# Patient Record
Sex: Female | Born: 1990 | Race: Black or African American | Hispanic: No | Marital: Single | Smoking: Current every day smoker
Health system: Southern US, Community
[De-identification: ages and names within clinical notes are randomized; demographics above are authoritative.]

## PROBLEM LIST (undated history)

## (undated) HISTORY — PX: DILATION AND CURETTAGE OF UTERUS: SHX78

---

## 2009-06-27 ENCOUNTER — Emergency Department (HOSPITAL_COMMUNITY): Admission: EM | Admit: 2009-06-27 | Discharge: 2009-06-28 | Payer: Self-pay | Admitting: Emergency Medicine

## 2010-05-05 LAB — CBC
HCT: 30.7 % — ABNORMAL LOW (ref 36.0–46.0)
Hemoglobin: 10.5 g/dL — ABNORMAL LOW (ref 12.0–15.0)
MCV: 88.7 fL (ref 78.0–100.0)
RBC: 3.46 MIL/uL — ABNORMAL LOW (ref 3.87–5.11)
RDW: 13.3 % (ref 11.5–15.5)

## 2010-05-05 LAB — URINE CULTURE: Culture: NO GROWTH

## 2010-05-05 LAB — URINE MICROSCOPIC-ADD ON

## 2010-05-05 LAB — DIFFERENTIAL
Basophils Absolute: 0 10*3/uL (ref 0.0–0.1)
Basophils Relative: 0 % (ref 0–1)
Eosinophils Absolute: 0.2 10*3/uL (ref 0.0–0.7)
Eosinophils Relative: 2 % (ref 0–5)
Lymphocytes Relative: 19 % (ref 12–46)
Monocytes Absolute: 0.7 10*3/uL (ref 0.1–1.0)
Neutro Abs: 9 10*3/uL — ABNORMAL HIGH (ref 1.7–7.7)

## 2010-05-05 LAB — WET PREP, GENITAL: Yeast Wet Prep HPF POC: NONE SEEN

## 2010-05-05 LAB — URINALYSIS, ROUTINE W REFLEX MICROSCOPIC
Bilirubin Urine: NEGATIVE
Hgb urine dipstick: NEGATIVE
Nitrite: NEGATIVE
pH: 7 (ref 5.0–8.0)

## 2010-05-05 LAB — POCT PREGNANCY, URINE: Preg Test, Ur: POSITIVE

## 2010-05-05 LAB — RPR: RPR Ser Ql: NONREACTIVE

## 2010-05-05 LAB — GC/CHLAMYDIA PROBE AMP, GENITAL: Chlamydia, DNA Probe: NEGATIVE

## 2013-11-03 ENCOUNTER — Emergency Department (HOSPITAL_BASED_OUTPATIENT_CLINIC_OR_DEPARTMENT_OTHER): Payer: Medicaid Other

## 2013-11-03 ENCOUNTER — Encounter (HOSPITAL_BASED_OUTPATIENT_CLINIC_OR_DEPARTMENT_OTHER): Payer: Self-pay | Admitting: Emergency Medicine

## 2013-11-03 ENCOUNTER — Emergency Department (HOSPITAL_BASED_OUTPATIENT_CLINIC_OR_DEPARTMENT_OTHER)
Admission: EM | Admit: 2013-11-03 | Discharge: 2013-11-03 | Disposition: A | Discharge status: Home / Self Care | Payer: Medicaid Other | Attending: Emergency Medicine | Admitting: Emergency Medicine

## 2013-11-03 DIAGNOSIS — Y929 Unspecified place or not applicable: Secondary | ICD-10-CM | POA: Insufficient documentation

## 2013-11-03 DIAGNOSIS — Y9389 Activity, other specified: Secondary | ICD-10-CM | POA: Insufficient documentation

## 2013-11-03 DIAGNOSIS — S99919A Unspecified injury of unspecified ankle, initial encounter: Secondary | ICD-10-CM

## 2013-11-03 DIAGNOSIS — X500XXA Overexertion from strenuous movement or load, initial encounter: Secondary | ICD-10-CM | POA: Insufficient documentation

## 2013-11-03 DIAGNOSIS — S8990XA Unspecified injury of unspecified lower leg, initial encounter: Secondary | ICD-10-CM | POA: Diagnosis present

## 2013-11-03 DIAGNOSIS — F17200 Nicotine dependence, unspecified, uncomplicated: Secondary | ICD-10-CM | POA: Insufficient documentation

## 2013-11-03 DIAGNOSIS — IMO0002 Reserved for concepts with insufficient information to code with codable children: Secondary | ICD-10-CM | POA: Diagnosis not present

## 2013-11-03 DIAGNOSIS — S8392XA Sprain of unspecified site of left knee, initial encounter: Secondary | ICD-10-CM

## 2013-11-03 DIAGNOSIS — S99929A Unspecified injury of unspecified foot, initial encounter: Secondary | ICD-10-CM

## 2013-11-03 MED ORDER — HYDROCODONE-ACETAMINOPHEN 5-325 MG PO TABS: 1.0000 | ORAL_TABLET | ORAL | Status: DC | PRN

## 2013-11-03 MED ORDER — IBUPROFEN 800 MG PO TABS: 800.0000 mg | ORAL_TABLET | Freq: Three times a day (TID) | ORAL | Status: DC

## 2013-11-03 MED ORDER — HYDROCODONE-ACETAMINOPHEN 5-325 MG PO TABS
1.0000 | ORAL_TABLET | Freq: Once | ORAL | Status: AC
  Administered 2013-11-03: 1 via ORAL
  Filled 2013-11-03: qty 1

## 2013-11-03 MED ORDER — IBUPROFEN 800 MG PO TABS
800.0000 mg | ORAL_TABLET | Freq: Once | ORAL | Status: AC
  Administered 2013-11-03: 800 mg via ORAL
  Filled 2013-11-03: qty 1

## 2013-11-03 NOTE — Discharge Instructions (Signed)
Cryotherapy °Cryotherapy is when you put ice on your injury. Ice helps lessen pain and puffiness (swelling) after an injury. Ice works the best when you start using it in the first 24 to 48 hours after an injury. °HOME CARE °· Put a dry or damp towel between the ice pack and your skin. °· You may press gently on the ice pack. °· Leave the ice on for no more than 10 to 20 minutes at a time. °· Check your skin after 5 minutes to make sure your skin is okay. °· Rest at least 20 minutes between ice pack uses. °· Stop using ice when your skin loses feeling (numbness). °· Do not use ice on someone who cannot tell you when it hurts. This includes small children and people with memory problems (dementia). °GET HELP RIGHT AWAY IF: °· You have white spots on your skin. °· Your skin turns blue or pale. °· Your skin feels waxy or hard. °· Your puffiness gets worse. °MAKE SURE YOU:  °· Understand these instructions. °· Will watch your condition. °· Will get help right away if you are not doing well or get worse. °Document Released: 07/21/2007 Document Revised: 04/26/2011 Document Reviewed: 09/24/2010 °[employer]® Patient Information ©2015 [employer]. This information is not intended to replace advice given to you by your health care provider. Make sure you discuss any questions you have with your health care provider. ° °Joint Sprain °A sprain is a tear or stretch in the ligaments that hold a joint together. Severe sprains may need as long as 3-6 weeks of immobilization and/or exercises to heal completely. Sprained joints should be rested and protected. If not, they can become unstable and prone to re-injury. Proper treatment can reduce your pain, shorten the period of disability, and reduce the risk of repeated injuries. °TREATMENT  °· Rest and elevate the injured joint to reduce pain and swelling. °· Apply ice packs to the injury for 20-30 minutes every 2-3 hours for the next 2-3 days. °· Keep the injury wrapped in a  compression bandage or splint as long as the joint is painful or as instructed by your caregiver. °· Do not use the injured joint until it is completely healed to prevent re-injury and chronic instability. Follow the instructions of your caregiver. °· Long-term sprain management may require exercises and/or treatment by a physical therapist. Taping or special braces may help stabilize the joint until it is completely better. °SEEK MEDICAL CARE IF:  °· You develop increased pain or swelling of the joint. °· You develop increasing redness and warmth of the joint. °· You develop a fever. °· It becomes stiff. °· Your hand or foot gets cold or numb. °Document Released: 03/11/2004 Document Revised: 04/26/2011 Document Reviewed: 02/19/2008 °Exit[employer]® Patient Information ©2015 [employer]. This information is not intended to replace advice given to you by your health care provider. Make sure you discuss any questions you have with your health care provider. ° °

## 2013-11-03 NOTE — ED Provider Notes (Signed)
Medical screening examination/treatment/procedure(s) were performed by non-physician practitioner and as supervising physician I was immediately available for consultation/collaboration.  Joreen Swearingin, MD 11/03/13 2356 

## 2013-11-03 NOTE — ED Provider Notes (Signed)
CSN: [id number]     Arrival date & time 11/03/13  1556 History   First MD Initiated Contact with Patient 11/03/13 1710     Chief Complaint  Patient presents with  . Knee Pain     (Consider location/radiation/quality/duration/timing/severity/associated sxs/prior Treatment) Patient is a 23 y.o. female presenting with knee pain. The history is provided by the patient. No language interpreter was used.  Knee Pain Location:  Knee Injury: yes   Knee location:  L knee Pain details:    Severity:  Moderate   Duration:  1 day   Timing:  Constant Chronicity:  New Foreign body present:  No foreign bodies Prior injury to area:  No Associated symptoms: no fever   Associated symptoms comment:  Twisting injury to left knee last night. No fall or direct blow to knee. She complains of pain with ambulation. No other injury.   History reviewed. No pertinent past medical history. History reviewed. No pertinent past surgical history. No family history on file. History  Substance Use Topics  . Smoking status: Current Every Day Smoker  . Smokeless tobacco: Not on file  . Alcohol Use: Yes     Comment: occasionally   OB History   Grav Para Term Preterm Abortions TAB SAB Ect Mult Living                 Review of Systems  Constitutional: Negative for fever and chills.  HENT: Negative.   Respiratory: Negative.   Cardiovascular: Negative.   Gastrointestinal: Negative.   Musculoskeletal:       See HPI.  Skin: Negative.   Neurological: Negative.       Allergies  Review of patient's allergies indicates no known allergies.  Home Medications   Prior to Admission medications   Not on File   BP 108/60  Pulse 88  Temp(Src) 98.2 F (36.8 C) (Oral)  Resp 18  Ht  (1.676 m)  Wt 123 lb (55.792 kg)  BMI 19.86 kg/m2  SpO2 100%  LMP 11/02/2013 Physical Exam  Constitutional: She is oriented to person, place, and time. She appears well-developed and well-nourished.  Neck: Normal range  of motion.  Cardiovascular: Intact distal pulses.   Pulmonary/Chest: Effort normal.  Musculoskeletal:  Minimal swelling of left knee.No palpable effusion. Joint stable. Painful active and passive range of motion. No calf or thigh tenderness.   Neurological: She is alert and oriented to person, place, and time.  Skin: Skin is warm and dry.    ED Course  Procedures (including critical care time) Labs Review Labs Reviewed - No data to display  Imaging Review Dg Knee Complete 4 Views Left  11/03/2013   CLINICAL DATA:  Left knee pain following a twisting injury last night.  EXAM: LEFT KNEE - COMPLETE 4+ VIEW  COMPARISON:  None.  FINDINGS: There is no evidence of fracture, dislocation, or joint effusion. There is no evidence of arthropathy or other focal bone abnormality. Soft tissues are unremarkable.  IMPRESSION: Normal examination.   Electronically Signed   By: Gordan Payment M.D.   On: 11/03/2013 16:26     EKG Interpretation None      MDM   Final diagnoses:  None    1. Knee sprain  Negative x-rays for fracture. Joint stable without laxity. Suspect sprain injury. Refer to Dr. Pearletha Forge prn.    Arnoldo Hooker, PA-C 11/03/13 1741

## 2013-11-03 NOTE — ED Notes (Signed)
Pt reports stepping into a pothole last night and twisted left knee - causing pain and decreased ROM with edema today.

## 2014-03-21 ENCOUNTER — Emergency Department (HOSPITAL_BASED_OUTPATIENT_CLINIC_OR_DEPARTMENT_OTHER): Payer: Medicaid Other

## 2014-03-21 ENCOUNTER — Encounter (HOSPITAL_BASED_OUTPATIENT_CLINIC_OR_DEPARTMENT_OTHER): Payer: Self-pay | Admitting: *Deleted

## 2014-03-21 ENCOUNTER — Emergency Department (HOSPITAL_BASED_OUTPATIENT_CLINIC_OR_DEPARTMENT_OTHER)
Admission: EM | Admit: 2014-03-21 | Discharge: 2014-03-21 | Disposition: A | Discharge status: Home / Self Care | Payer: Medicaid Other | Attending: Emergency Medicine | Admitting: Emergency Medicine

## 2014-03-21 DIAGNOSIS — Z3A09 9 weeks gestation of pregnancy: Secondary | ICD-10-CM | POA: Diagnosis not present

## 2014-03-21 DIAGNOSIS — Z791 Long term (current) use of non-steroidal anti-inflammatories (NSAID): Secondary | ICD-10-CM | POA: Insufficient documentation

## 2014-03-21 DIAGNOSIS — Z79891 Long term (current) use of opiate analgesic: Secondary | ICD-10-CM | POA: Diagnosis not present

## 2014-03-21 DIAGNOSIS — O039 Complete or unspecified spontaneous abortion without complication: Secondary | ICD-10-CM | POA: Insufficient documentation

## 2014-03-21 DIAGNOSIS — O98811 Other maternal infectious and parasitic diseases complicating pregnancy, first trimester: Secondary | ICD-10-CM | POA: Insufficient documentation

## 2014-03-21 DIAGNOSIS — Z87891 Personal history of nicotine dependence: Secondary | ICD-10-CM | POA: Diagnosis not present

## 2014-03-21 DIAGNOSIS — N939 Abnormal uterine and vaginal bleeding, unspecified: Secondary | ICD-10-CM

## 2014-03-21 DIAGNOSIS — O209 Hemorrhage in early pregnancy, unspecified: Secondary | ICD-10-CM | POA: Diagnosis present

## 2014-03-21 DIAGNOSIS — B379 Candidiasis, unspecified: Secondary | ICD-10-CM | POA: Insufficient documentation

## 2014-03-21 LAB — CBC WITH DIFFERENTIAL/PLATELET
BASOS ABS: 0 10*3/uL (ref 0.0–0.1)
Basophils Relative: 0 % (ref 0–1)
Eosinophils Absolute: 0.1 10*3/uL (ref 0.0–0.7)
Eosinophils Relative: 2 % (ref 0–5)
HEMATOCRIT: 35.4 % — AB (ref 36.0–46.0)
Hemoglobin: 12 g/dL (ref 12.0–15.0)
LYMPHS PCT: 38 % (ref 12–46)
Lymphs Abs: 2.4 10*3/uL (ref 0.7–4.0)
MCH: 29.4 pg (ref 26.0–34.0)
MCHC: 33.9 g/dL (ref 30.0–36.0)
MCV: 86.8 fL (ref 78.0–100.0)
MONO ABS: 0.5 10*3/uL (ref 0.1–1.0)
Monocytes Relative: 7 % (ref 3–12)
NEUTROS ABS: 3.3 10*3/uL (ref 1.7–7.7)
NEUTROS PCT: 53 % (ref 43–77)
PLATELETS: 242 10*3/uL (ref 150–400)
RBC: 4.08 MIL/uL (ref 3.87–5.11)
RDW: 11.7 % (ref 11.5–15.5)
WBC: 6.3 10*3/uL (ref 4.0–10.5)

## 2014-03-21 LAB — BASIC METABOLIC PANEL
Anion gap: 4 — ABNORMAL LOW (ref 5–15)
BUN: 8 mg/dL (ref 6–23)
CO2: 23 mmol/L (ref 19–32)
Calcium: 8.8 mg/dL (ref 8.4–10.5)
Chloride: 108 mmol/L (ref 96–112)
Creatinine, Ser: 0.65 mg/dL (ref 0.50–1.10)
GFR calc Af Amer: >90 mL/min (ref >90)
GLUCOSE: 90 mg/dL (ref 70–99)
POTASSIUM: 3.8 mmol/L (ref 3.5–5.1)
Sodium: 135 mmol/L (ref 135–145)

## 2014-03-21 LAB — URINALYSIS, ROUTINE W REFLEX MICROSCOPIC
BILIRUBIN URINE: NEGATIVE
Glucose, UA: NEGATIVE mg/dL
HGB URINE DIPSTICK: NEGATIVE
KETONES UR: 15 mg/dL — AB
NITRITE: NEGATIVE
PH: 6.5 (ref 5.0–8.0)
Protein, ur: NEGATIVE mg/dL
SPECIFIC GRAVITY, URINE: 1.029 (ref 1.005–1.030)
UROBILINOGEN UA: 1 mg/dL (ref 0.0–1.0)

## 2014-03-21 LAB — WET PREP, GENITAL
Clue Cells Wet Prep HPF POC: NONE SEEN
Trich, Wet Prep: NONE SEEN

## 2014-03-21 LAB — URINE MICROSCOPIC-ADD ON

## 2014-03-21 LAB — PREGNANCY, URINE: Preg Test, Ur: POSITIVE — AB

## 2014-03-21 LAB — RH IG WORKUP (INCLUDES ABO/RH)
ABO/RH(D): O POS
Gestational Age(Wks): 12

## 2014-03-21 LAB — HCG, QUANTITATIVE, PREGNANCY: HCG, BETA CHAIN, QUANT, S: 41985 m[IU]/mL — AB (ref <5)

## 2014-03-21 MED ORDER — FLUCONAZOLE 50 MG PO TABS
150.0000 mg | ORAL_TABLET | Freq: Once | ORAL | Status: AC
  Administered 2014-03-21: 150 mg via ORAL
  Filled 2014-03-21 (×2): qty 1

## 2014-03-21 NOTE — ED Provider Notes (Signed)
CSN: [id number]     Arrival date & time 03/21/14  1126 History   First MD Initiated Contact with Patient 03/21/14 1204     Chief Complaint  Patient presents with  . Vaginal Bleeding    8wk preg     (Consider location/radiation/quality/duration/timing/severity/associated sxs/prior Treatment) HPI Comments: Pt comes in with c/o vaginal bleeding during pregnancy. Pt is nine weeks old and has confirmed urine pregnancy at ob but has not had any testing. G3p2; states that she was having some white thick discharge consistent with yeast and she decided to treat with monistat otc. And in the last couple of days the discharge has been brown and white. Mild abdominal cramping. Unsure of blood type.   The history is provided by the patient.    History reviewed. No pertinent past medical history. History reviewed. No pertinent past surgical history. No family history on file. History  Substance Use Topics  . Smoking status: Former Smoker    Types: Cigarettes    Quit date: 02/28/2014  . Smokeless tobacco: Never Used  . Alcohol Use: No     Comment: occasionally   OB History    Gravida Para Term Preterm AB TAB SAB Ectopic Multiple Living   1              Review of Systems  All other systems reviewed and are negative.     Allergies  Review of patient's allergies indicates no known allergies.  Home Medications   Prior to Admission medications   Medication Sig Start Date End Date Taking? Authorizing Provider  Prenatal Multivit-Min-Fe-FA (PRENATAL VITAMINS PO) Take by mouth.   Yes Historical Provider, MD  HYDROcodone-acetaminophen (NORCO/VICODIN) 5-325 MG per tablet Take 1-2 tablets by mouth every 4 (four) hours as needed for moderate pain. 11/03/13   Shari A Upstill, PA-C  ibuprofen (ADVIL,MOTRIN) 800 MG tablet Take 1 tablet (800 mg total) by mouth 3 (three) times daily. 11/03/13   Melvenia BeamShari A Upstill, PA-C   BP 111/68 mmHg  Pulse 94  Temp(Src) 98.3 F (36.8 C) (Oral)  Resp 16  SpO2 100%   LMP 01/11/2014 Physical Exam  Constitutional: She is oriented to person, place, and time. She appears well-developed and well-nourished.  Cardiovascular: Normal rate and regular rhythm.   Pulmonary/Chest: Effort normal and breath sounds normal.  Abdominal: Soft. Bowel sounds are normal. There is no tenderness.  Genitourinary:  Thick white and brown discharge. -cmt  Musculoskeletal: Normal range of motion.  Neurological: She is alert and oriented to person, place, and time.  Skin: Skin is warm and dry.  Psychiatric: She has a normal mood and affect.  Nursing note and vitals reviewed.   ED Course  Procedures (including critical care time) Labs Review Labs Reviewed  WET PREP, GENITAL - Abnormal; Notable for the following:    Yeast Wet Prep HPF POC TOO NUMEROUS TO COUNT (*)    WBC, Wet Prep HPF POC MANY (*)    All other components within normal limits  PREGNANCY, URINE - Abnormal; Notable for the following:    Preg Test, Ur POSITIVE (*)    All other components within normal limits  URINALYSIS, ROUTINE W REFLEX MICROSCOPIC - Abnormal; Notable for the following:    Ketones, ur 15 (*)    Leukocytes, UA SMALL (*)    All other components within normal limits  URINE MICROSCOPIC-ADD ON - Abnormal; Notable for the following:    Bacteria, UA FEW (*)    All other components within normal limits  CBC WITH DIFFERENTIAL/PLATELET - Abnormal; Notable for the following:    HCT 35.4 (*)    All other components within normal limits  BASIC METABOLIC PANEL - Abnormal; Notable for the following:    Anion gap 4 (*)    All other components within normal limits  HCG, QUANTITATIVE, PREGNANCY - Abnormal; Notable for the following:    hCG, Beta Chain, Quant, [place] [address] (*)    All other components within normal limits  URINE CULTURE  RH IG WORKUP (INCLUDES ABO/RH)  GC/CHLAMYDIA PROBE AMP ([place])    Imaging Review US Ob Comp Less 14 Wks  03/21/2014   CLINICAL DATA:  Vaginal bleeding and pelvic  cramping in first trimester pregnancy.  EXAM: OBSTETRIC <14 WK [place] AND TRANSVAGINAL OB US  TECHNIQUE: Both transabdominal and transvaginal ultrasound examinations were performed for complete evaluation of the gestation as well as the maternal uterus, adnexal regions, and pelvic cul-de-sac. Transvaginal technique was performed to assess early pregnancy.  COMPARISON:  09/01/2012  FINDINGS: Intrauterine gestational sac: Present. There is a large subchorionic hemorrhage encompassing at least 50% of the gestational sac circumference.  Yolk sac: Present but abnormally enlarged, measuring 8 mm in diameter.  Embryo:  Present  Cardiac Activity: Not visible by motion Doppler, color Doppler, or cine imaging.  CRL:   22  mm   8 w 6 d  Maternal uterus/adnexae: Corpus luteum noted in the right ovary. Otherwise, the ovaries are unremarkable. No free pelvic fluid.  IMPRESSION: Single intrauterine gestation, 22 mm crown-rump length with no cardiac activity. Findings meet definitive criteria for failed pregnancy. This follows SRU consensus guidelines: Diagnostic Criteria for Nonviable Pregnancy Early in the First Trimester. [facility] [phone].   Electronically Signed   By: Tiburcio Pea M.D.   On: 03/21/2014 14:31   US Ob Transvaginal  03/21/2014   CLINICAL DATA:  Vaginal bleeding and pelvic cramping in first trimester pregnancy.  EXAM: OBSTETRIC <14 WK [place] AND TRANSVAGINAL OB US  TECHNIQUE: Both transabdominal and transvaginal ultrasound examinations were performed for complete evaluation of the gestation as well as the maternal uterus, adnexal regions, and pelvic cul-de-sac. Transvaginal technique was performed to assess early pregnancy.  COMPARISON:  09/01/2012  FINDINGS: Intrauterine gestational sac: Present. There is a large subchorionic hemorrhage encompassing at least 50% of the gestational sac circumference.  Yolk sac: Present but abnormally enlarged, measuring 8 mm in diameter.  Embryo:  Present  Cardiac  Activity: Not visible by motion Doppler, color Doppler, or cine imaging.  CRL:   22  mm   8 w 6 d  Maternal uterus/adnexae: Corpus luteum noted in the right ovary. Otherwise, the ovaries are unremarkable. No free pelvic fluid.  IMPRESSION: Single intrauterine gestation, 22 mm crown-rump length with no cardiac activity. Findings meet definitive criteria for failed pregnancy. This follows SRU consensus guidelines: Diagnostic Criteria for Nonviable Pregnancy Early in the First Trimester. [facility] [phone].   Electronically Signed   By: Tiburcio Pea M.D.   On: 03/21/2014 14:31     EKG Interpretation None      MDM   Final diagnoses:  Miscarriage  Candidiasis    Discussed results with pt. Pt has ob at [facility]. Discussed follow up and pt verbalized understanding. Pt given diflucan here for yeast    Teressa Lower, NP 03/21/14 1457  Linwood Dibbles, MD 03/21/14 [phone]

## 2014-03-21 NOTE — Discharge Instructions (Signed)
As discussed you need to follow up with your doctor tomorrow or Monday. If you develop more bleeding then one pad per hour you need to be seen again Miscarriage A miscarriage is the sudden loss of an unborn baby (fetus) before the 20th week of pregnancy. Most miscarriages happen in the first 3 months of pregnancy. Sometimes, it happens before a woman even knows she is pregnant. A miscarriage is also called a "spontaneous miscarriage" or "early pregnancy loss." Having a miscarriage can be an emotional experience. Talk with your caregiver about any questions you may have about miscarrying, the grieving process, and your future pregnancy plans. CAUSES   Problems with the fetal chromosomes that make it impossible for the baby to develop normally. Problems with the baby's genes or chromosomes are most often the result of errors that occur, by chance, as the embryo divides and grows. The problems are not inherited from the parents.  Infection of the cervix or uterus.   Hormone problems.   Problems with the cervix, such as having an incompetent cervix. This is when the tissue in the cervix is not strong enough to hold the pregnancy.   Problems with the uterus, such as an abnormally shaped uterus, uterine fibroids, or congenital abnormalities.   Certain medical conditions.   Smoking, drinking alcohol, or taking illegal drugs.   Trauma.  Often, the cause of a miscarriage is unknown.  SYMPTOMS   Vaginal bleeding or spotting, with or without cramps or pain.  Pain or cramping in the abdomen or lower back.  Passing fluid, tissue, or blood clots from the vagina. DIAGNOSIS  Your caregiver will perform a physical exam. You may also have an ultrasound to confirm the miscarriage. Blood or urine tests may also be ordered. TREATMENT   Sometimes, treatment is not necessary if you naturally pass all the fetal tissue that was in the uterus. If some of the fetus or placenta remains in the body  (incomplete miscarriage), tissue left behind may become infected and must be removed. Usually, a dilation and curettage (D and C) procedure is performed. During a D and C procedure, the cervix is widened (dilated) and any remaining fetal or placental tissue is gently removed from the uterus.  Antibiotic medicines are prescribed if there is an infection. Other medicines may be given to reduce the size of the uterus (contract) if there is a lot of bleeding.  If you have Rh negative blood and your baby was Rh positive, you will need a Rh immunoglobulin shot. This shot will protect any future baby from having Rh blood problems in future pregnancies. HOME CARE INSTRUCTIONS   Your caregiver may order bed rest or may allow you to continue light activity. Resume activity as directed by your caregiver.  Have someone help with home and family responsibilities during this time.   Keep track of the number of sanitary pads you use each day and how soaked (saturated) they are. Write down this information.   Do not use tampons. Do not douche or have sexual intercourse until approved by your caregiver.   Only take over-the-counter or prescription medicines for pain or discomfort as directed by your caregiver.   Do not take aspirin. Aspirin can cause bleeding.   Keep all follow-up appointments with your caregiver.   If you or your partner have problems with grieving, talk to your caregiver or seek counseling to help cope with the pregnancy loss. Allow enough time to grieve before trying to get pregnant again.  SEEK  IMMEDIATE MEDICAL CARE IF:   You have severe cramps or pain in your back or abdomen.  You have a fever.  You pass large blood clots (walnut-sized or larger) ortissue from your vagina. Save any tissue for your caregiver to inspect.   Your bleeding increases.   You have a thick, bad-smelling vaginal discharge.  You become lightheaded, weak, or you faint.   You have chills.   MAKE SURE YOU:  Understand these instructions.  Will watch your condition.  Will get help right away if you are not doing well or get worse. Document Released: 07/28/2000 Document Revised: 05/29/2012 Document Reviewed: 03/23/2011 [facility] Patient Information 2015 [place], [place]. This information is not intended to replace advice given to you by your health care provider. Make sure you discuss any questions you have with your health care provider.

## 2014-03-21 NOTE — ED Notes (Signed)
Reports vaginal bleeding x 3 days- pt pregnant, LMP Nov 27, 3rd pregnancy

## 2014-03-22 LAB — URINE CULTURE
CULTURE: NO GROWTH
Colony Count: NO GROWTH

## 2014-03-22 LAB — GC/CHLAMYDIA PROBE AMP (~~LOC~~) NOT AT ARMC
CHLAMYDIA, DNA PROBE: NEGATIVE
NEISSERIA GONORRHEA: NEGATIVE

## 2014-04-18 ENCOUNTER — Emergency Department (HOSPITAL_BASED_OUTPATIENT_CLINIC_OR_DEPARTMENT_OTHER)
Admission: EM | Admit: 2014-04-18 | Discharge: 2014-04-18 | Disposition: A | Discharge status: Home / Self Care | Payer: Medicaid Other | Attending: Emergency Medicine | Admitting: Emergency Medicine

## 2014-04-18 ENCOUNTER — Encounter (HOSPITAL_BASED_OUTPATIENT_CLINIC_OR_DEPARTMENT_OTHER): Payer: Self-pay

## 2014-04-18 DIAGNOSIS — Z791 Long term (current) use of non-steroidal anti-inflammatories (NSAID): Secondary | ICD-10-CM | POA: Diagnosis not present

## 2014-04-18 DIAGNOSIS — Z87891 Personal history of nicotine dependence: Secondary | ICD-10-CM | POA: Insufficient documentation

## 2014-04-18 DIAGNOSIS — Z3202 Encounter for pregnancy test, result negative: Secondary | ICD-10-CM | POA: Insufficient documentation

## 2014-04-18 DIAGNOSIS — N72 Inflammatory disease of cervix uteri: Secondary | ICD-10-CM | POA: Diagnosis not present

## 2014-04-18 DIAGNOSIS — R102 Pelvic and perineal pain: Secondary | ICD-10-CM | POA: Diagnosis present

## 2014-04-18 LAB — PREGNANCY, URINE: Preg Test, Ur: NEGATIVE

## 2014-04-18 LAB — URINE MICROSCOPIC-ADD ON

## 2014-04-18 LAB — URINALYSIS, ROUTINE W REFLEX MICROSCOPIC
BILIRUBIN URINE: NEGATIVE
Glucose, UA: NEGATIVE mg/dL
HGB URINE DIPSTICK: NEGATIVE
KETONES UR: NEGATIVE mg/dL
NITRITE: NEGATIVE
Protein, ur: NEGATIVE mg/dL
Specific Gravity, Urine: 1.029 (ref 1.005–1.030)
UROBILINOGEN UA: 1 mg/dL (ref 0.0–1.0)
pH: 6 (ref 5.0–8.0)

## 2014-04-18 LAB — WET PREP, GENITAL
Trich, Wet Prep: NONE SEEN
Yeast Wet Prep HPF POC: NONE SEEN

## 2014-04-18 MED ORDER — LIDOCAINE HCL (PF) 1 % IJ SOLN
INTRAMUSCULAR | Status: AC
  Administered 2014-04-18: 1.2 mL
  Filled 2014-04-18: qty 5

## 2014-04-18 MED ORDER — OXYCODONE-ACETAMINOPHEN 5-325 MG PO TABS: 1.0000 | ORAL_TABLET | ORAL | Status: DC | PRN

## 2014-04-18 MED ORDER — METRONIDAZOLE 500 MG PO TABS: 500.0000 mg | ORAL_TABLET | Freq: Two times a day (BID) | ORAL | Status: DC

## 2014-04-18 MED ORDER — CEFTRIAXONE SODIUM 250 MG IJ SOLR
250.0000 mg | Freq: Once | INTRAMUSCULAR | Status: AC
  Administered 2014-04-18: 250 mg via INTRAMUSCULAR
  Filled 2014-04-18: qty 250

## 2014-04-18 MED ORDER — AZITHROMYCIN 250 MG PO TABS
1000.0000 mg | ORAL_TABLET | Freq: Once | ORAL | Status: AC
  Administered 2014-04-18: 1000 mg via ORAL
  Filled 2014-04-18: qty 4

## 2014-04-18 MED ORDER — VALACYCLOVIR HCL 1 G PO TABS: 1000.0000 mg | ORAL_TABLET | Freq: Two times a day (BID) | ORAL | Status: DC

## 2014-04-18 MED ORDER — ACETAMINOPHEN 325 MG PO TABS
650.0000 mg | ORAL_TABLET | Freq: Once | ORAL | Status: AC
  Administered 2014-04-18: 650 mg via ORAL
  Filled 2014-04-18: qty 2

## 2014-04-18 NOTE — ED Notes (Signed)
Reports dx with yeast infection at last visit. Sts she has been using "cream" that was given to her son. Reports "bumps" to vagina.

## 2014-04-18 NOTE — ED Notes (Signed)
Pt had D&X 2 weeks ago

## 2014-04-18 NOTE — ED Notes (Signed)
Pt. Reports she has had a yeast infection for over a week and being treated for this yeast infection with not getting any better.  Pt. Reports she is still having burning and itching with pain and has only 2 pills left for treating the yeast infection.

## 2014-04-18 NOTE — Discharge Instructions (Signed)
Cervicitis Cervicitis is a soreness and swelling (inflammation) of the cervix. Your cervix is located at the bottom of your uterus. It opens up to the vagina. CAUSES   Sexually transmitted infections (STIs).   Allergic reaction.   Medicines or birth control devices that are put in the vagina.   Injury to the cervix.   Bacterial infections.  RISK FACTORS You are at greater risk if you:  Have unprotected sexual intercourse.  Have sexual intercourse with many partners.  Began sexual intercourse at an early age.  Have a history of STIs. SYMPTOMS  There may be no symptoms. If symptoms occur, they may include:   Gray, white, yellow, or bad-smelling vaginal discharge.   Pain or itching of the area outside the vagina.   Painful sexual intercourse.   Lower abdominal or lower back pain, especially during intercourse.   Frequent urination.   Abnormal vaginal bleeding between periods, after sexual intercourse, or after menopause.   Pressure or a heavy feeling in the pelvis.  DIAGNOSIS  Diagnosis is made after a pelvic exam. Other tests may include:   Examination of any discharge under a microscope (wet prep).   A Pap test.  TREATMENT  Treatment will depend on the cause of cervicitis. If it is caused by an STI, both you and your partner will need to be treated. Antibiotic medicines will be given.  HOME CARE INSTRUCTIONS   Do not have sexual intercourse until your health care provider says it is okay.   Do not have sexual intercourse until your partner has been treated, if your cervicitis is caused by an STI.   Take your antibiotics as directed. Finish them even if you start to feel better.  SEEK MEDICAL CARE IF:  Your symptoms come back.   You have a fever.  MAKE SURE YOU:   Understand these instructions.  Will watch your condition.  Will get help right away if you are not doing well or get worse. Document Released: 02/01/2005 Document Revised:  02/06/2013 Document Reviewed: 07/26/2012 [facility] Patient Information 2015 [place], [place]. This information is not intended to replace advice given to you by your health care provider. Make sure you discuss any questions you have with your health care provider.   Genital Herpes Genital herpes is a sexually transmitted disease. This means that it is a disease passed by having sex with an infected person. There is no cure for genital herpes. The time between attacks can be months to years. The virus may live in a person but produce no problems (symptoms). This infection can be passed to a baby as it travels down the birth canal (vagina). In a newborn, this can cause central nervous system damage, eye damage, or even death. The virus that causes genital herpes is usually HSV-2 virus. The virus that causes oral herpes is usually HSV-1. The diagnosis (learning what is wrong) is made through culture results. SYMPTOMS  Usually symptoms of pain and itching begin a few days to a week after contact. It first appears as small blisters that progress to small painful ulcers which then scab over and heal after several days. It affects the outer genitalia, birth canal, cervix, penis, anal area, buttocks, and thighs. HOME CARE INSTRUCTIONS   Keep ulcerated areas dry and clean.  Take medications as directed. Antiviral medications can speed up healing. They will not prevent recurrences or cure this infection. These medications can also be taken for suppression if there are frequent recurrences.  While the infection is active,  it is contagious. Avoid all sexual contact during active infections.  Condoms may help prevent spread of the herpes virus.  Practice safe sex.  Wash your hands thoroughly after touching the genital area.  Avoid touching your eyes after touching your genital area.  Inform your caregiver if you have had genital herpes and become pregnant. It is your responsibility to insure a safe outcome  for your baby in this pregnancy.  Only take over-the-counter or prescription medicines for pain, discomfort, or fever as directed by your caregiver. SEEK MEDICAL CARE IF:   You have a recurrence of this infection.  You do not respond to medications and are not improving.  You have new sources of pain or discharge which have changed from the original infection.  You have an oral temperature above 102 F (38.9 C).  You develop abdominal pain.  You develop eye pain or signs of eye infection. Document Released: 01/30/2000 Document Revised: 04/26/2011 Document Reviewed: 02/19/2009 [facility] Patient Information 2015 [place]. This information is not intended to replace advice given to you by your health care provider. Make sure you discuss any questions you have with your health care provider.   Bacterial Vaginosis Bacterial vaginosis is a vaginal infection that occurs when the normal balance of bacteria in the vagina is disrupted. It results from an overgrowth of certain bacteria. This is the most common vaginal infection in women of childbearing age. Treatment is important to prevent complications, especially in pregnant women, as it can cause a premature delivery. CAUSES  Bacterial vaginosis is caused by an increase in harmful bacteria that are normally present in smaller amounts in the vagina. Several different kinds of bacteria can cause bacterial vaginosis. However, the reason that the condition develops is not fully understood. RISK FACTORS Certain activities or behaviors can put you at an increased risk of developing bacterial vaginosis, including:  Having a new sex partner or multiple sex partners.  Douching.  Using an intrauterine device (IUD) for contraception. Women do not get bacterial vaginosis from toilet seats, bedding, swimming pools, or contact with objects around them. SIGNS AND SYMPTOMS  Some women with bacterial vaginosis have no signs or symptoms. Common  symptoms include:  Grey vaginal discharge.  A fishlike odor with discharge, especially after sexual intercourse.  Itching or burning of the vagina and vulva.  Burning or pain with urination. DIAGNOSIS  Your health care provider will take a medical history and examine the vagina for signs of bacterial vaginosis. A sample of vaginal fluid may be taken. Your health care provider will look at this sample under a microscope to check for bacteria and abnormal cells. A vaginal pH test may also be done.  TREATMENT  Bacterial vaginosis may be treated with antibiotic medicines. These may be given in the form of a pill or a vaginal cream. A second round of antibiotics may be prescribed if the condition comes back after treatment.  HOME CARE INSTRUCTIONS   Only take over-the-counter or prescription medicines as directed by your health care provider.  If antibiotic medicine was prescribed, take it as directed. Make sure you finish it even if you start to feel better.  Do not have sex until treatment is completed.  Tell all sexual partners that you have a vaginal infection. They should see their health care provider and be treated if they have problems, such as a mild rash or itching.  Practice safe sex by using condoms and only having one sex partner. SEEK MEDICAL CARE IF:  Your symptoms are not improving after 3 days of treatment.  You have increased discharge or pain.  You have a fever. MAKE SURE YOU:   Understand these instructions.  Will watch your condition.  Will get help right away if you are not doing well or get worse. FOR MORE INFORMATION  Centers for Disease Control and Prevention, Division of STD Prevention: [employer] American Sexual Health Association (ASHA): www.ashastd.org  Document Released: 02/01/2005 Document Revised: 11/22/2012 Document Reviewed: 09/13/2012 [facility] Patient Information 2015 [place], [place]. This information is not intended to replace advice  given to you by your health care provider. Make sure you discuss any questions you have with your health care provider.

## 2014-04-18 NOTE — ED Provider Notes (Signed)
CSN: [id number]     Arrival date & time 04/18/14  1445 History   First MD Initiated Contact with Patient 04/18/14 1543     Chief Complaint  Patient presents with  . Vaginal Pain     (Consider location/radiation/quality/duration/timing/severity/associated sxs/prior Treatment) HPI  24 year old female presents with vaginal pain, bleeding, and "cuts" for the past 3 days. She states 4 days ago she had intercourse but denies any pain during intercourse. This pain started the next day. She denies having discharge that she states is a yeast infection for the past one week. She was given an antibiotic for it (cefdinir). Also being treated for a UTI. Patient is also complaining of pain in her buttocks. She has also noticed bumps on her labia. Rates her pain as severe. The OB/GYN she saw her earlier rather ibuprofen and Percocet and she is now out of the Percocet. That seems to be the only thing that was helping.  History reviewed. No pertinent past medical history. History reviewed. No pertinent past surgical history. No family history on file. History  Substance Use Topics  . Smoking status: Former Smoker    Types: Cigarettes    Quit date: 02/28/2014  . Smokeless tobacco: Never Used  . Alcohol Use: No     Comment: occasionally   OB History    Gravida Para Term Preterm AB TAB SAB Ectopic Multiple Living   1              Review of Systems  Gastrointestinal: Negative for blood in stool.  Genitourinary: Positive for dysuria, vaginal bleeding, vaginal discharge and vaginal pain.  All other systems reviewed and are negative.     Allergies  Review of patient's allergies indicates no known allergies.  Home Medications   Prior to Admission medications   Medication Sig Start Date End Date Taking? Authorizing Provider  HYDROcodone-acetaminophen (NORCO/VICODIN) 5-325 MG per tablet Take 1-2 tablets by mouth every 4 (four) hours as needed for moderate pain. 11/03/13   Shari A Upstill, PA-C    ibuprofen (ADVIL,MOTRIN) 800 MG tablet Take 1 tablet (800 mg total) by mouth 3 (three) times daily. 11/03/13   Shari A Upstill, PA-C  Prenatal Multivit-Min-Fe-FA (PRENATAL VITAMINS PO) Take by mouth.    Historical Provider, MD   BP 144/89 mmHg  Pulse 84  Temp(Src) 97.6 F (36.4 C) (Oral)  Resp 16  Ht  (1.676 m)  Wt 126 lb (57.153 kg)  BMI 20.35 kg/m2  SpO2 100%  LMP 01/11/2014 Physical Exam  Constitutional: She is oriented to person, place, and time. She appears well-developed and well-nourished.  HENT:  Head: Normocephalic and atraumatic.  Right Ear: External ear normal.  Left Ear: External ear normal.  Nose: Nose normal.  Eyes: Right eye exhibits no discharge. Left eye exhibits no discharge.  Neck: Neck supple.  Cardiovascular: Normal rate, regular rhythm and normal heart sounds.   Pulmonary/Chest: Effort normal and breath sounds normal.  Abdominal: Soft. She exhibits no distension. There is tenderness in the suprapubic area.  Genitourinary:    There is lesion (fluid filled vesicles) on the right labia. Uterus is not enlarged and not tender. Cervix exhibits discharge and friability. Right adnexum displays no mass. Left adnexum displays no mass. No bleeding in the vagina. No signs of injury around the vagina. Vaginal discharge found.  Neurological: She is alert and oriented to person, place, and time.  Skin: Skin is warm and dry.  Nursing note and vitals reviewed.   ED Course  Procedures (  including critical care time) Labs Review Labs Reviewed  WET PREP, GENITAL - Abnormal; Notable for the following:    Clue Cells Wet Prep HPF POC MODERATE (*)    WBC, Wet Prep HPF POC MODERATE (*)    All other components within normal limits  URINALYSIS, ROUTINE W REFLEX MICROSCOPIC - Abnormal; Notable for the following:    APPearance CLOUDY (*)    Leukocytes, UA MODERATE (*)    All other components within normal limits  URINE MICROSCOPIC-ADD ON - Abnormal; Notable for the  following:    Squamous Epithelial / LPF FEW (*)    Bacteria, UA FEW (*)    All other components within normal limits  PREGNANCY, URINE  GC/CHLAMYDIA PROBE AMP ([place])    Imaging Review No results found.   EKG Interpretation None      MDM   Final diagnoses:  Cervicitis    Patient's external exam is concerning for new onset herpes. She'll be treated with valacyclovir. She also be given oral pain control. In the discharge and friability seen on her cervix she will also be covered for gonorrhea and chlamydia. She is complaining of a sheet odored discharge and has moderate clue cells. Given this we'll treat with Flagyl as well. I discussed the importance of following up with her GYN as soon as possible for possible scraping and confirmatory HSV testing. Patient verbalized understanding.    Audree CamelScott T Lucresha Dismuke, MD 04/18/14 [phone]

## 2014-04-19 LAB — GC/CHLAMYDIA PROBE AMP (~~LOC~~) NOT AT ARMC
Chlamydia: NEGATIVE
Neisseria Gonorrhea: NEGATIVE

## 2014-05-14 ENCOUNTER — Emergency Department (HOSPITAL_BASED_OUTPATIENT_CLINIC_OR_DEPARTMENT_OTHER)
Admission: EM | Admit: 2014-05-14 | Discharge: 2014-05-14 | Disposition: A | Discharge status: Home / Self Care | Payer: Medicaid Other | Attending: Emergency Medicine | Admitting: Emergency Medicine

## 2014-05-14 ENCOUNTER — Emergency Department (HOSPITAL_BASED_OUTPATIENT_CLINIC_OR_DEPARTMENT_OTHER): Payer: Medicaid Other

## 2014-05-14 ENCOUNTER — Encounter (HOSPITAL_BASED_OUTPATIENT_CLINIC_OR_DEPARTMENT_OTHER): Payer: Self-pay | Admitting: Emergency Medicine

## 2014-05-14 DIAGNOSIS — W01198A Fall on same level from slipping, tripping and stumbling with subsequent striking against other object, initial encounter: Secondary | ICD-10-CM | POA: Diagnosis not present

## 2014-05-14 DIAGNOSIS — S62609A Fracture of unspecified phalanx of unspecified finger, initial encounter for closed fracture: Secondary | ICD-10-CM

## 2014-05-14 DIAGNOSIS — S62657A Nondisplaced fracture of medial phalanx of left little finger, initial encounter for closed fracture: Secondary | ICD-10-CM | POA: Diagnosis not present

## 2014-05-14 DIAGNOSIS — Z87891 Personal history of nicotine dependence: Secondary | ICD-10-CM | POA: Insufficient documentation

## 2014-05-14 DIAGNOSIS — Y9289 Other specified places as the place of occurrence of the external cause: Secondary | ICD-10-CM | POA: Insufficient documentation

## 2014-05-14 DIAGNOSIS — Y9302 Activity, running: Secondary | ICD-10-CM | POA: Diagnosis not present

## 2014-05-14 DIAGNOSIS — Y998 Other external cause status: Secondary | ICD-10-CM | POA: Diagnosis not present

## 2014-05-14 DIAGNOSIS — S6992XA Unspecified injury of left wrist, hand and finger(s), initial encounter: Secondary | ICD-10-CM | POA: Diagnosis present

## 2014-05-14 MED ORDER — NAPROXEN 500 MG PO TABS: 500.0000 mg | ORAL_TABLET | Freq: Two times a day (BID) | ORAL | Status: DC
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
## 2016-07-07 ENCOUNTER — Emergency Department (HOSPITAL_BASED_OUTPATIENT_CLINIC_OR_DEPARTMENT_OTHER)
Admission: EM | Admit: 2016-07-07 | Discharge: 2016-07-07 | Disposition: A | Discharge status: Home / Self Care | Payer: Medicaid Other | Attending: Physician Assistant | Admitting: Physician Assistant

## 2016-07-07 ENCOUNTER — Encounter (HOSPITAL_BASED_OUTPATIENT_CLINIC_OR_DEPARTMENT_OTHER): Payer: Self-pay | Admitting: *Deleted

## 2016-07-07 ENCOUNTER — Emergency Department (HOSPITAL_BASED_OUTPATIENT_CLINIC_OR_DEPARTMENT_OTHER): Payer: Medicaid Other

## 2016-07-07 DIAGNOSIS — N760 Acute vaginitis: Secondary | ICD-10-CM

## 2016-07-07 DIAGNOSIS — O99331 Smoking (tobacco) complicating pregnancy, first trimester: Secondary | ICD-10-CM | POA: Insufficient documentation

## 2016-07-07 DIAGNOSIS — O23591 Infection of other part of genital tract in pregnancy, first trimester: Secondary | ICD-10-CM | POA: Insufficient documentation

## 2016-07-07 DIAGNOSIS — F17210 Nicotine dependence, cigarettes, uncomplicated: Secondary | ICD-10-CM | POA: Insufficient documentation

## 2016-07-07 DIAGNOSIS — B9689 Other specified bacterial agents as the cause of diseases classified elsewhere: Secondary | ICD-10-CM

## 2016-07-07 DIAGNOSIS — Z3A01 Less than 8 weeks gestation of pregnancy: Secondary | ICD-10-CM | POA: Insufficient documentation

## 2016-07-07 LAB — WET PREP, GENITAL
SPERM: NONE SEEN
Trich, Wet Prep: NONE SEEN
Yeast Wet Prep HPF POC: NONE SEEN

## 2016-07-07 LAB — CBC WITH DIFFERENTIAL/PLATELET
Basophils Absolute: 0 10*3/uL (ref 0.0–0.1)
Basophils Relative: 0 %
Eosinophils Absolute: 0.1 10*3/uL (ref 0.0–0.7)
Eosinophils Relative: 1 %
HCT: 30.5 % — ABNORMAL LOW (ref 36.0–46.0)
Hemoglobin: 10 g/dL — ABNORMAL LOW (ref 12.0–15.0)
LYMPHS ABS: 3.1 10*3/uL (ref 0.7–4.0)
LYMPHS PCT: 43 %
MCH: 27.6 pg (ref 26.0–34.0)
MCHC: 32.8 g/dL (ref 30.0–36.0)
MCV: 84.3 fL (ref 78.0–100.0)
MONO ABS: 0.5 10*3/uL (ref 0.1–1.0)
MONOS PCT: 7 %
Neutro Abs: 3.5 10*3/uL (ref 1.7–7.7)
Neutrophils Relative %: 49 %
PLATELETS: 237 10*3/uL (ref 150–400)
RBC: 3.62 MIL/uL — ABNORMAL LOW (ref 3.87–5.11)
RDW: 13.6 % (ref 11.5–15.5)
WBC: 7.2 10*3/uL (ref 4.0–10.5)

## 2016-07-07 LAB — URINALYSIS, ROUTINE W REFLEX MICROSCOPIC
BILIRUBIN URINE: NEGATIVE
GLUCOSE, UA: NEGATIVE mg/dL
Hgb urine dipstick: NEGATIVE
Ketones, ur: NEGATIVE mg/dL
Leukocytes, UA: NEGATIVE
Nitrite: NEGATIVE
Protein, ur: NEGATIVE mg/dL
SPECIFIC GRAVITY, URINE: 1.026 (ref 1.005–1.030)
pH: 7.5 (ref 5.0–8.0)

## 2016-07-07 LAB — COMPREHENSIVE METABOLIC PANEL
ALT: 11 U/L — ABNORMAL LOW (ref 14–54)
ANION GAP: 7 (ref 5–15)
AST: 14 U/L — ABNORMAL LOW (ref 15–41)
Albumin: 3.9 g/dL (ref 3.5–5.0)
Alkaline Phosphatase: 37 U/L — ABNORMAL LOW (ref 38–126)
BUN: 9 mg/dL (ref 6–20)
CO2: 24 mmol/L (ref 22–32)
CREATININE: 0.71 mg/dL (ref 0.44–1.00)
Calcium: 8.4 mg/dL — ABNORMAL LOW (ref 8.9–10.3)
Chloride: 106 mmol/L (ref 101–111)
GFR calc non Af Amer: >60 mL/min (ref >60)
Glucose, Bld: 89 mg/dL (ref 65–99)
Potassium: 3.5 mmol/L (ref 3.5–5.1)
SODIUM: 137 mmol/L (ref 135–145)
Total Bilirubin: 0.3 mg/dL (ref 0.3–1.2)
Total Protein: 6.5 g/dL (ref 6.5–8.1)

## 2016-07-07 LAB — PREGNANCY, URINE: Preg Test, Ur: POSITIVE — AB

## 2016-07-07 LAB — ABO/RH: ABO/RH(D): O POS

## 2016-07-07 LAB — HCG, QUANTITATIVE, PREGNANCY: HCG, BETA CHAIN, QUANT, S: 40451 m[IU]/mL — AB (ref <5)

## 2016-07-07 MED ORDER — METRONIDAZOLE 500 MG PO TABS
500.0000 mg | ORAL_TABLET | Freq: Once | ORAL | Status: AC
  Administered 2016-07-07: 500 mg via ORAL
  Filled 2016-07-07: qty 1

## 2016-07-07 MED ORDER — METRONIDAZOLE 500 MG PO TABS: 500.0000 mg | ORAL_TABLET | Freq: Two times a day (BID) | ORAL | 0 refills | Status: AC

## 2016-07-07 NOTE — ED Provider Notes (Signed)
[facility] Provider Note   CSN: [id number] Arrival date & time: 07/07/16  1712  By signing my name below, I, Rosana Fretana Waskiewicz, attest that this documentation has been prepared under the direction and in the presence of Hamish Banks, Cindee Saltourteney Lyn, MD. Electronically Signed: Rosana Fretana Waskiewicz, ED Scribe. 07/07/16. 6:55 PM.  History   Chief Complaint Chief Complaint  Patient presents with  . Vaginal Discharge    The history is provided by the patient. No language interpreter was used.   HPI Comments: Dana Riggs is a pregnant  26 y.o. female with a PMHx of miscarriage, who presents to the Emergency Department complaining of sudden onset, moderate vaginal discharge onset 2 days ago. Pt has had a hx of similar symptoms in one of her past pregnancies. G5 P3 A1. Pt reports associated vaginal bleeding (spotting), headache, abdominal pain, and back pain. Pt states she sees Dr. [facility]el at [facility]. No other complaints at this time.  History reviewed. No pertinent past medical history.  There are no active problems to display for this patient.   Past Surgical History:  Procedure Laterality Date  . DILATION AND CURETTAGE OF UTERUS      OB History    Gravida Para Term Preterm AB Living   1             SAB TAB Ectopic Multiple Live Births                   Home Medications    Prior to Admission medications   Not on File    Family History History reviewed. No pertinent family history.  Social History Social History  Substance Use Topics  . Smoking status: Current Every Day Smoker    Packs/day: 0.50    Types: Cigarettes    Last attempt to quit: 02/28/2014  . Smokeless tobacco: Never Used  . Alcohol use No     Comment: occasionally   DIAGNOSTIC STUDIES: Oxygen Saturation is 100% on RA, normal by my interpretation.   COORDINATION OF CARE: 10:48 PM-Discussed next steps with pt. Pt verbalized understanding and is agreeable with the plan.     Allergies   Patient has no known allergies.   Review of Systems Review of Systems  Gastrointestinal: Positive for abdominal pain.  Genitourinary: Positive for vaginal bleeding and vaginal discharge.  Musculoskeletal: Positive for back pain.  Neurological: Positive for headaches.  All other systems reviewed and are negative.    Physical Exam Updated Vital Signs BP 111/73 (BP Location: Right Arm)   Pulse 65   Temp 97.8 F (36.6 C)   Resp 18   Ht 5\' 3"  (1.6 m)   Wt 54.4 kg (120 lb)   LMP 06/08/2016   SpO2 100%   BMI 21.26 kg/m   Physical Exam  Constitutional: She is oriented to person, place, and time. She appears well-developed and well-nourished. No distress.  HENT:  Head: Normocephalic and atraumatic.  Eyes: Pupils are equal, round, and reactive to light.  Pulmonary/Chest: Effort normal.  Genitourinary: Vaginal discharge found.  Genitourinary Comments: Cervix is finger tip open. Gray discharge at the cervical os.    Neurological: She is alert and oriented to person, place, and time.  Skin: Skin is warm and dry.  Psychiatric: She has a normal mood and affect.  Nursing note and vitals reviewed.    ED Treatments / Results  DIAGNOSTIC STUDIES: Oxygen Saturation is 100% on RA, normal by my interpretation.   COORDINATION OF CARE: 6:46 PM-Discussed next  steps with pt including lab work, transvaginal US and a pelvic exam. Pt verbalized understanding and is agreeable with the plan.   Labs (all labs ordered are listed, but only abnormal results are displayed) Labs Reviewed  WET PREP, GENITAL - Abnormal; Notable for the following:       Result Value   Clue Cells Wet Prep HPF POC PRESENT (*)    WBC, Wet Prep HPF POC MODERATE (*)    All other components within normal limits  PREGNANCY, URINE - Abnormal; Notable for the following:    Preg Test, Ur POSITIVE (*)    All other components within normal limits  CBC WITH DIFFERENTIAL/PLATELET - Abnormal; Notable for  the following:    RBC 3.62 (*)    Hemoglobin 10.0 (*)    HCT 30.5 (*)    All other components within normal limits  COMPREHENSIVE METABOLIC PANEL - Abnormal; Notable for the following:    Calcium 8.4 (*)    AST 14 (*)    ALT 11 (*)    Alkaline Phosphatase 37 (*)    All other components within normal limits  HCG, QUANTITATIVE, PREGNANCY - Abnormal; Notable for the following:    hCG, Beta Chain, Quant, S 40,451 (*)    All other components within normal limits  URINALYSIS, ROUTINE W REFLEX MICROSCOPIC  ABO/RH  GC/CHLAMYDIA PROBE AMP ([place]) NOT AT [facility]    EKG  EKG Interpretation None       Radiology US Ob Comp Less 14 Wks  Result Date: 07/07/2016 CLINICAL DATA:  Positive pregnancy test and vaginal bleeding. EXAM: OBSTETRIC <14 WK [place] AND TRANSVAGINAL OB US TECHNIQUE: Both transabdominal and transvaginal ultrasound examinations were performed for complete evaluation of the gestation as well as the maternal uterus, adnexal regions, and pelvic cul-de-sac. Transvaginal technique was performed to assess early pregnancy. COMPARISON:  03/21/2014 FINDINGS: Intrauterine gestational sac: Present Yolk sac:  Visualized. Embryo:  Visualized. Cardiac Activity: Not Visualized. CRL:  3  mm   5 w   6 d                  [place] EDC: 03/03/2017 Subchorionic hemorrhage:  None visualized. Maternal uterus/adnexae: Within the uterus, there is a second fluid-filled structure. Structure is larger than the gestational sac and measures 3.6 x 2.0 x 2.6 cm. This structure has internal irregular echoes. There is not a yolk sac or fetal pole within this second larger intrauterine fluid collection. Normal appearance of the left ovary. Small amount of free fluid. Evidence for a right ovarian corpus luteal cyst. IMPRESSION: Intrauterine pregnancy with calculated gestational age of [redacted] weeks and 6 days. Question a twin pregnancy with a second fluid-filled structure within the uterus. This second fluid-filled structure is  larger than the other gestational sac and there is no evidence for a yolk sac or fetus within this fluid collection. This fluid collection is also not compatible with a typical subchorionic hemorrhage. This could represent a twin fetal demise. Recommend follow up ultrasound and OB consultation. Electronically Signed   By: Richarda Overlie M.D.   On: 07/07/2016 21:42   US Ob Transvaginal  Result Date: 07/07/2016 CLINICAL DATA:  Positive pregnancy test and vaginal bleeding. EXAM: OBSTETRIC <14 WK [place] AND TRANSVAGINAL OB US TECHNIQUE: Both transabdominal and transvaginal ultrasound examinations were performed for complete evaluation of the gestation as well as the maternal uterus, adnexal regions, and pelvic cul-de-sac. Transvaginal technique was performed to assess early pregnancy. COMPARISON:  03/21/2014 FINDINGS: Intrauterine gestational sac: Present  Yolk sac:  Visualized. Embryo:  Visualized. Cardiac Activity: Not Visualized. CRL:  3  mm   5 w   6 d                  [place] EDC: 03/03/2017 Subchorionic hemorrhage:  None visualized. Maternal uterus/adnexae: Within the uterus, there is a second fluid-filled structure. Structure is larger than the gestational sac and measures 3.6 x 2.0 x 2.6 cm. This structure has internal irregular echoes. There is not a yolk sac or fetal pole within this second larger intrauterine fluid collection. Normal appearance of the left ovary. Small amount of free fluid. Evidence for a right ovarian corpus luteal cyst. IMPRESSION: Intrauterine pregnancy with calculated gestational age of [redacted] weeks and 6 days. Question a twin pregnancy with a second fluid-filled structure within the uterus. This second fluid-filled structure is larger than the other gestational sac and there is no evidence for a yolk sac or fetus within this fluid collection. This fluid collection is also not compatible with a typical subchorionic hemorrhage. This could represent a twin fetal demise. Recommend follow up ultrasound and  OB consultation. Electronically Signed   By: Richarda Overlie M.D.   On: 07/07/2016 21:42    Procedures Procedures (including critical care time)  Medications Ordered in ED Medications  metroNIDAZOLE (FLAGYL) tablet 500 mg (not administered)     Initial Impression / Assessment and Plan / ED Course  I have reviewed the triage vital signs and the nursing notes.  Pertinent labs & imaging results that were available during my care of the patient were reviewed by me and considered in my medical decision making (see chart for details).    I personally performed the services described in this documentation, which was scribed in my presence. The recorded information has been reviewed and is accurate.   Patient here with discharge and bleeding in early pregnancy. Patient was diagnosed with pregnancy today. We'll get hCG, ABO. Transvaginal ultrasound. Vaginal exam shows gray discharge and bloody discharge with a mildly open os fingertip.  10:48 PM Patient's ABO positive. Patient's ultrasound shows a di-di pregnancy with fetal demise of 1 and normally developement of the other. Discussed with her OB/GYN and call for Dr. Allena Katz. (dr. Philis Pique) the plan is to have patient follow up with the OB/GYN group either tomorrow or the next day. Discussed with patient. This is an undesired pregnancy. We will have her bring this up with her OB/GYN group. Because of this we will treat her bacterial vaginosis with metronidazole.  Final Clinical Impressions(s) / ED Diagnoses   Final diagnoses:  None    New Prescriptions New Prescriptions   No medications on file     Abelino Derrick, MD 07/07/16 2248

## 2016-07-07 NOTE — ED Triage Notes (Signed)
Pt c/o vaginal discharge x 1 week, vaginal spotting x 2 days, ? preg

## 2016-07-07 NOTE — Discharge Instructions (Signed)
You were seen today for bleeding and cramping. He was diagnosed with new pregnancy. It one of the embryos is growing well. The other embryo looks to have fetal demise. We think this tissue continue to pass.   Please follow up with your OB/GYN tomorrow or the next day. You also have bacterial vaginosis which syou should  treat with the medication provided.

## 2016-07-08 LAB — GC/CHLAMYDIA PROBE AMP (~~LOC~~) NOT AT ARMC
Chlamydia: NEGATIVE
Neisseria Gonorrhea: NEGATIVE

## 2016-07-26 ENCOUNTER — Emergency Department (HOSPITAL_BASED_OUTPATIENT_CLINIC_OR_DEPARTMENT_OTHER): Payer: Medicaid Other

## 2016-07-26 ENCOUNTER — Emergency Department (HOSPITAL_BASED_OUTPATIENT_CLINIC_OR_DEPARTMENT_OTHER)
Admission: EM | Admit: 2016-07-26 | Discharge: 2016-07-26 | Disposition: A | Discharge status: Home / Self Care | Payer: Medicaid Other | Attending: Emergency Medicine | Admitting: Emergency Medicine

## 2016-07-26 ENCOUNTER — Encounter (HOSPITAL_BASED_OUTPATIENT_CLINIC_OR_DEPARTMENT_OTHER): Payer: Self-pay | Admitting: Emergency Medicine

## 2016-07-26 DIAGNOSIS — O200 Threatened abortion: Secondary | ICD-10-CM

## 2016-07-26 DIAGNOSIS — Z3A08 8 weeks gestation of pregnancy: Secondary | ICD-10-CM | POA: Insufficient documentation

## 2016-07-26 DIAGNOSIS — O208 Other hemorrhage in early pregnancy: Secondary | ICD-10-CM | POA: Insufficient documentation

## 2016-07-26 DIAGNOSIS — O468X1 Other antepartum hemorrhage, first trimester: Secondary | ICD-10-CM

## 2016-07-26 DIAGNOSIS — O26891 Other specified pregnancy related conditions, first trimester: Secondary | ICD-10-CM | POA: Insufficient documentation

## 2016-07-26 DIAGNOSIS — O209 Hemorrhage in early pregnancy, unspecified: Secondary | ICD-10-CM | POA: Insufficient documentation

## 2016-07-26 DIAGNOSIS — F17210 Nicotine dependence, cigarettes, uncomplicated: Secondary | ICD-10-CM | POA: Insufficient documentation

## 2016-07-26 DIAGNOSIS — O418X10 Other specified disorders of amniotic fluid and membranes, first trimester, not applicable or unspecified: Secondary | ICD-10-CM

## 2016-07-26 LAB — HCG, QUANTITATIVE, PREGNANCY: hCG, Beta Chain, Quant, S: 167660 m[IU]/mL — ABNORMAL HIGH (ref <5)

## 2016-07-26 NOTE — ED Triage Notes (Signed)
Vag bleeding off and on since last week

## 2016-07-26 NOTE — ED Notes (Signed)
Patient transported to Ultrasound 

## 2016-07-26 NOTE — ED Provider Notes (Signed)
[facility] Note   CSN: [id number] Arrival date & time: 07/26/16  0849     History   Chief Complaint Chief Complaint  Patient presents with  . Vaginal Bleeding    HPI Dana Riggs is a 26 y.o. female.  HPI   26 year old female with vaginal bleeding. Patient seen the emergency room on 5/23. She was diagnosed with a possible twin gestation with fetal demise of one of them. She reports that she followed up with her OB/GYN but that practice did not support her desire for abortion. Her spotting and discharge did improve after last ED visit but began having spotting again Thursday. This morning she was having intercourse and had "a lot of blood" which has since slowed. Some sharp lower back pain at that time which is now a dull ache. No urinary complaints. No dizziness or lightheadedness.   History reviewed. No pertinent past medical history.  There are no active problems to display for this patient.   Past Surgical History:  Procedure Laterality Date  . DILATION AND CURETTAGE OF UTERUS      OB History    Gravida Para Term Preterm AB Living   2             SAB TAB Ectopic Multiple Live Births                   Home Medications    Prior to Admission medications   Medication Sig Start Date End Date Taking? Authorizing Provider  metroNIDAZOLE (FLAGYL) 500 MG tablet Take 1 tablet (500 mg total) by mouth 2 (two) times daily. 07/07/16   Mackuen, Cindee Saltourteney Lyn, MD    Family History History reviewed. No pertinent family history.  Social History Social History  Substance Use Topics  . Smoking status: Current Every Day Smoker    Packs/day: 0.50    Types: Cigarettes    Last attempt to quit: 02/28/2014  . Smokeless tobacco: Never Used  . Alcohol use No     Comment: occasionally     Allergies   Patient has no known allergies.   Review of Systems Review of Systems  All systems reviewed and negative, other than as noted in HPI.   Physical  Exam Updated Vital Signs BP 95/74   Pulse 81   Temp 98 F (36.7 C)   Resp 18   Ht 5\' 5"  (1.651 m)   Wt 54.4 kg (120 lb)   LMP 06/14/2016   SpO2 100%   BMI 19.97 kg/m   Physical Exam  Constitutional: She appears well-developed and well-nourished. No distress.  HENT:  Head: Normocephalic and atraumatic.  Eyes: Conjunctivae are normal. Right eye exhibits no discharge. Left eye exhibits no discharge.  Neck: Neck supple.  Cardiovascular: Normal rate, regular rhythm and normal heart sounds.  Exam reveals no gallop and no friction rub.   No murmur heard. Pulmonary/Chest: Effort normal and breath sounds normal. No respiratory distress.  Abdominal: Soft. She exhibits no distension. There is no tenderness.  Musculoskeletal: She exhibits no edema or tenderness.  Neurological: She is alert.  Skin: Skin is warm and dry.  Psychiatric: She has a normal mood and affect. Her behavior is normal. Thought content normal.  Nursing note and vitals reviewed.    ED Treatments / Results  Labs (all labs ordered are listed, but only abnormal results are displayed) Labs Reviewed  HCG, QUANTITATIVE, PREGNANCY - Abnormal; Notable for the following:       Result Value  hCG, Beta ChainMahalia Longest 161,096 (*)    All other components within normal limits    EKG  EKG Interpretation None       Radiology US Ob Comp Less 14 Wks  Result Date: 07/26/2016 CLINICAL DATA:  Heavy vaginal bleeding EXAM: OBSTETRIC <14 WK ULTRASOUND TECHNIQUE: Transabdominal ultrasound was performed for evaluation of the gestation as well as the maternal uterus and adnexal regions. COMPARISON:  07/07/2016 FINDINGS: Intrauterine gestational sac: Single Yolk sac:  Visualized. Embryo:  Visualized. Cardiac Activity: Visualized. Heart Rate: 168 bpm CRL:   22.3  mm   8 w 6 d                  [place] EDC: 03/01/2017 Subchorionic hemorrhage: Moderate to large subchorionic/chorionic hemorrhage. Maternal uterus/adnexae: Bilateral ovaries are  within normal limits, noting a right corpus luteal cyst. No free fluid. IMPRESSION: Single live intrauterine gestation, with estimated gestational age [redacted] weeks 6 days by crown-rump length. Moderate to large subchorionic/chorionic hemorrhage. Electronically Signed   By: Charline Bills M.D.   On: 07/26/2016 11:17    Procedures Procedures (including critical care time)  Medications Ordered in ED Medications - No data to display   Initial Impression / Assessment and Plan / ED Course  I have reviewed the triage vital signs and the nursing notes.  Pertinent labs & imaging results that were available during my care of the patient were reviewed by me and considered in my medical decision making (see chart for details).     25yF with vaginal bleeding. Pregnancy diagnosed in ED on 5/23. [place] then with likely twin gestation and demise of one. Rh positive. HD stable. Abdominal exam benign. Will obtain another quant although I'm not sure how twin gestation/demise of one may possibly effect levels. Can [place] to evaluate viability of other fetus. Bleeding significantly slowed now  11:25 AM [place] as above. Threatened miscarriage. Moderate/large subchorionic hemorrhage. Expectant management. OB FU. Prenatal vitamins if going to keep pregnancy. Return precautions discussed.   Final Clinical Impressions(s) / ED Diagnoses   Final diagnoses:  Threatened miscarriage  Subchorionic hematoma in first trimester, single or unspecified fetus    New Prescriptions New Prescriptions   No medications on file     Raeford Razor, MD 07/26/16 1129

## 2016-08-23 ENCOUNTER — Emergency Department (HOSPITAL_BASED_OUTPATIENT_CLINIC_OR_DEPARTMENT_OTHER): Payer: Self-pay

## 2016-08-23 ENCOUNTER — Encounter (HOSPITAL_BASED_OUTPATIENT_CLINIC_OR_DEPARTMENT_OTHER): Payer: Self-pay | Admitting: Emergency Medicine

## 2016-08-23 ENCOUNTER — Emergency Department (HOSPITAL_BASED_OUTPATIENT_CLINIC_OR_DEPARTMENT_OTHER)
Admission: EM | Admit: 2016-08-23 | Discharge: 2016-08-23 | Disposition: A | Discharge status: Home / Self Care | Payer: Self-pay | Attending: Emergency Medicine | Admitting: Emergency Medicine

## 2016-08-23 DIAGNOSIS — O9989 Other specified diseases and conditions complicating pregnancy, childbirth and the puerperium: Secondary | ICD-10-CM | POA: Insufficient documentation

## 2016-08-23 DIAGNOSIS — O43101 Malformation of placenta, unspecified, first trimester: Secondary | ICD-10-CM

## 2016-08-23 DIAGNOSIS — F17210 Nicotine dependence, cigarettes, uncomplicated: Secondary | ICD-10-CM | POA: Insufficient documentation

## 2016-08-23 DIAGNOSIS — O209 Hemorrhage in early pregnancy, unspecified: Secondary | ICD-10-CM | POA: Insufficient documentation

## 2016-08-23 DIAGNOSIS — Z79899 Other long term (current) drug therapy: Secondary | ICD-10-CM | POA: Insufficient documentation

## 2016-08-23 DIAGNOSIS — O200 Threatened abortion: Secondary | ICD-10-CM

## 2016-08-23 DIAGNOSIS — Z3A13 13 weeks gestation of pregnancy: Secondary | ICD-10-CM | POA: Insufficient documentation

## 2016-08-23 LAB — CBC WITH DIFFERENTIAL/PLATELET
Basophils Absolute: 0 K/uL (ref 0.0–0.1)
Basophils Relative: 0 %
Eosinophils Absolute: 0.1 K/uL (ref 0.0–0.7)
Eosinophils Relative: 1 %
HCT: 32.5 % — ABNORMAL LOW (ref 36.0–46.0)
Hemoglobin: 11.3 g/dL — ABNORMAL LOW (ref 12.0–15.0)
Lymphocytes Relative: 31 %
Lymphs Abs: 2.3 K/uL (ref 0.7–4.0)
MCH: 28.7 pg (ref 26.0–34.0)
MCHC: 34.8 g/dL (ref 30.0–36.0)
MCV: 82.5 fL (ref 78.0–100.0)
Monocytes Absolute: 0.4 K/uL (ref 0.1–1.0)
Monocytes Relative: 6 %
Neutro Abs: 4.7 K/uL (ref 1.7–7.7)
Neutrophils Relative %: 62 %
Platelets: 231 K/uL (ref 150–400)
RBC: 3.94 MIL/uL (ref 3.87–5.11)
RDW: 13.6 % (ref 11.5–15.5)
WBC: 7.6 K/uL (ref 4.0–10.5)

## 2016-08-23 LAB — COMPREHENSIVE METABOLIC PANEL
ALBUMIN: 3.9 g/dL (ref 3.5–5.0)
ALT: 11 U/L — AB (ref 14–54)
AST: 17 U/L (ref 15–41)
Alkaline Phosphatase: 34 U/L — ABNORMAL LOW (ref 38–126)
Anion gap: 9 (ref 5–15)
BILIRUBIN TOTAL: 0.4 mg/dL (ref 0.3–1.2)
BUN: 12 mg/dL (ref 6–20)
CHLORIDE: 104 mmol/L (ref 101–111)
CO2: 23 mmol/L (ref 22–32)
CREATININE: 0.74 mg/dL (ref 0.44–1.00)
Calcium: 8.8 mg/dL — ABNORMAL LOW (ref 8.9–10.3)
GFR calc Af Amer: >60 mL/min (ref >60)
GLUCOSE: 103 mg/dL — AB (ref 65–99)
Potassium: 3.1 mmol/L — ABNORMAL LOW (ref 3.5–5.1)
Sodium: 136 mmol/L (ref 135–145)
TOTAL PROTEIN: 7.1 g/dL (ref 6.5–8.1)

## 2016-08-23 LAB — WET PREP, GENITAL
Clue Cells Wet Prep HPF POC: NONE SEEN
Sperm: NONE SEEN
Trich, Wet Prep: NONE SEEN
Yeast Wet Prep HPF POC: NONE SEEN

## 2016-08-23 LAB — HCG, QUANTITATIVE, PREGNANCY: hCG, Beta Chain, Quant, S: 63806 m[IU]/mL — ABNORMAL HIGH

## 2016-08-23 MED ORDER — POTASSIUM CHLORIDE CRYS ER 20 MEQ PO TBCR
40.0000 meq | EXTENDED_RELEASE_TABLET | Freq: Once | ORAL | Status: AC
  Administered 2016-08-23: 40 meq via ORAL
  Filled 2016-08-23: qty 2

## 2016-08-23 MED ORDER — SODIUM CHLORIDE 0.9 % IV BOLUS (SEPSIS)
1000.0000 mL | Freq: Once | INTRAVENOUS | Status: AC
Start: 2016-08-23 — End: 2016-08-23
  Administered 2016-08-23: 1000 mL via INTRAVENOUS

## 2016-08-23 NOTE — ED Notes (Signed)
Patient transported to ultrasound.

## 2016-08-23 NOTE — ED Notes (Signed)
ED Provider at bedside. 

## 2016-08-23 NOTE — ED Triage Notes (Signed)
Pt states she is approximately 2 months pregnant. She had twins but lost one of them. Has not been to OB yet. Pt states she has been having vaginal bleeding with cramping this week.

## 2016-08-23 NOTE — ED Provider Notes (Signed)
[facility]   CSN: [id number] Arrival date & time: 08/23/16  1249     History   Chief Complaint Chief Complaint  Patient presents with  . Vaginal Bleeding    HPI Dana Riggs is a 26 y.o. female about [redacted] weeks pregnant, here presenting with vaginal bleeding. Patient was seen at the end of May for vaginal bleeding. At that time there seems to be possible twin pregnancy with fetal demise vs IUP with subchorionic hematoma. Patient was then seen a month ago for vaginal bleeding and had IUP with subchorionic hematoma. Patient states that over the last week or so, she has been having persistent spotting that stopped yesterday. She also has persistent lower abdominal cramps. Denies any fever or vomiting or urinary symptoms. Patient was recently treated for bacterial vaginosis and still has some brown colored discharge.      The history is provided by the patient.    History reviewed. No pertinent past medical history.  There are no active problems to display for this patient.   Past Surgical History:  Procedure Laterality Date  . DILATION AND CURETTAGE OF UTERUS      OB History    Gravida Para Term Preterm AB Living   2             SAB TAB Ectopic Multiple Live Births                   Home Medications    Prior to Admission medications   Medication Sig Start Date End Date Taking? Authorizing Provider  metroNIDAZOLE (FLAGYL) 500 MG tablet Take 1 tablet (500 mg total) by mouth 2 (two) times daily. 07/07/16   Mackuen, Cindee Salt, MD    Family History No family history on file.  Social History Social History  Substance Use Topics  . Smoking status: Current Every Day Smoker    Packs/day: 0.50    Types: Cigarettes    Last attempt to quit: 02/28/2014  . Smokeless tobacco: Never Used  . Alcohol use No     Comment: occasionally     Allergies   Patient has no known allergies.   Review of Systems Review of Systems  Gastrointestinal:  Positive for abdominal pain.  Genitourinary: Positive for vaginal bleeding.  All other systems reviewed and are negative.    Physical Exam Updated Vital Signs BP 104/75   Pulse 82   Temp 98.2 F (36.8 C) (Oral)   Resp 20   LMP 06/14/2016   SpO2 100%   Physical Exam  Constitutional: She is oriented to person, place, and time. She appears well-developed and well-nourished.  Anxious   HENT:  Head: Normocephalic.  Mouth/Throat: Oropharynx is clear and moist.  Eyes: EOM are normal. Pupils are equal, round, and reactive to light.  Neck: Normal range of motion. Neck supple.  Cardiovascular: Regular rhythm and normal heart sounds.   Slightly tachy   Pulmonary/Chest: Effort normal and breath sounds normal. No respiratory distress. She has no wheezes. She has no rales.  Abdominal: Soft. Bowel sounds are normal.  Gravid uterus   Genitourinary:  Genitourinary Comments: Brownish discharge. Os closed. Mild uterine tenderness, no adnexal tenderness   Musculoskeletal: Normal range of motion.  Neurological: She is alert and oriented to person, place, and time.  Skin: Skin is warm.  Psychiatric: She has a normal mood and affect.  Nursing note and vitals reviewed.    ED Treatments / Results  Labs (all labs ordered are listed, but  only abnormal results are displayed) Labs Reviewed  WET PREP, GENITAL - Abnormal; Notable for the following:       Result Value   WBC, Wet Prep HPF POC MANY (*)    All other components within normal limits  CBC WITH DIFFERENTIAL/PLATELET - Abnormal; Notable for the following:    Hemoglobin 11.3 (*)    HCT 32.5 (*)    All other components within normal limits  COMPREHENSIVE METABOLIC PANEL - Abnormal; Notable for the following:    Potassium 3.1 (*)    Glucose, Bld 103 (*)    Calcium 8.8 (*)    ALT 11 (*)    Alkaline Phosphatase 34 (*)    All other components within normal limits  HCG, QUANTITATIVE, PREGNANCY - Abnormal; Notable for the following:     hCG, Beta Chain, Quant, S 63,806 (*)    All other components within normal limits  GC/CHLAMYDIA PROBE AMP ([place]) NOT AT [facility]    EKG  EKG Interpretation None       Radiology [place] Ob Comp Less 14 Wks  Result Date: 08/23/2016 CLINICAL DATA:  Vaginal bleeding for 2 days, G5 P3 A1 EXAM: OBSTETRIC <14 WK ULTRASOUND TECHNIQUE: Transabdominal ultrasound was performed for evaluation of the gestation as well as the maternal uterus and adnexal regions. COMPARISON:  07/26/2016 FINDINGS: Intrauterine gestational sac: Present Yolk sac:  Not visualize Embryo:  Present Cardiac Activity: Present Heart Rate: 152 bpm CRL:   68.2  mm   13 w 1 d                  US EDC: 02/27/2017 Subchorionic hemorrhage:  None visualized. Maternal uterus/adnexae: RIGHT ovary normal size and morphology. LEFT ovary not visualized. Placenta is low-lying, cannot completely exclude placenta previa ; umbilical cord insertion is low near the cervix, recommend assessment on follow-up imaging. IMPRESSION: Single live intrauterine gestation as above. Low lying placenta at or near internal cervical os cannot exclude previa with note of low umbilical cord insertion near internal cervical os ; recommend assessment on follow-up imaging. Electronically Signed   By: Ulyses SouthwardMark  Boles M.D.   On: 08/23/2016 14:20    Procedures Procedures (including critical care time)  Medications Ordered in ED Medications  potassium chloride SA (K-DUR,KLOR-CON) CR tablet 40 mEq (not administered)  sodium chloride 0.9 % bolus 1,000 mL (0 mLs Intravenous Stopped 08/23/16 1514)     Initial Impression / Assessment and Plan / ED Course  I have reviewed the triage vital signs and the nursing notes.  Pertinent labs & imaging results that were available during my care of the patient were reviewed by me and considered in my medical decision making (see chart for details).     Dana Riggs is a 26 y.o. female here with vaginal bleeding. About [redacted] weeks pregnant  by date. Blood type is O positive. Likely threatened abortion. Will get labs, HCG, repeat OB US.   3:21 PM US showed possible placental previa likely explaining her continual bleeding. Hg stable. Tachycardia resolved with IVF. I [facility] Shawn[facility]att [facility]. She states that since patient is only [redacted] weeks pregnant, will not do any intervention. Recommend no sexual intercourse and follow up with OB.    Final Clinical Impressions(s) / ED Diagnoses   Final diagnoses:  None    New Prescriptions New Prescriptions   No medications on file     Charlynne PanderYao, David Hsienta, MD 08/23/16 [phone]

## 2016-08-23 NOTE — Discharge Instructions (Signed)
Your placenta may be very low down and that may cause bleeding. Avoid having sexual intercourse if you have bleeding.   [facility]   You may have some bleeding   Return to ER if you have worse bleeding, clots or tissue coming out, severe cramps, vomiting, fever.

## 2016-08-24 LAB — GC/CHLAMYDIA PROBE AMP (~~LOC~~) NOT AT ARMC
CHLAMYDIA, DNA PROBE: NEGATIVE
NEISSERIA GONORRHEA: NEGATIVE

## 2016-12-21 ENCOUNTER — Other Ambulatory Visit: Payer: Self-pay

## 2016-12-21 ENCOUNTER — Encounter (HOSPITAL_BASED_OUTPATIENT_CLINIC_OR_DEPARTMENT_OTHER): Payer: Self-pay | Admitting: Emergency Medicine

## 2016-12-21 ENCOUNTER — Emergency Department (HOSPITAL_BASED_OUTPATIENT_CLINIC_OR_DEPARTMENT_OTHER)
Admission: EM | Admit: 2016-12-21 | Discharge: 2016-12-21 | Disposition: A | Discharge status: Home / Self Care | Payer: Medicaid Other | Attending: Emergency Medicine | Admitting: Emergency Medicine

## 2016-12-21 DIAGNOSIS — M542 Cervicalgia: Secondary | ICD-10-CM | POA: Insufficient documentation

## 2016-12-21 DIAGNOSIS — O9989 Other specified diseases and conditions complicating pregnancy, childbirth and the puerperium: Secondary | ICD-10-CM | POA: Insufficient documentation

## 2016-12-21 DIAGNOSIS — F17210 Nicotine dependence, cigarettes, uncomplicated: Secondary | ICD-10-CM | POA: Insufficient documentation

## 2016-12-21 DIAGNOSIS — R070 Pain in throat: Secondary | ICD-10-CM | POA: Diagnosis not present

## 2016-12-21 DIAGNOSIS — Z3A29 29 weeks gestation of pregnancy: Secondary | ICD-10-CM | POA: Insufficient documentation

## 2016-12-21 DIAGNOSIS — O99333 Smoking (tobacco) complicating pregnancy, third trimester: Secondary | ICD-10-CM | POA: Insufficient documentation

## 2016-12-21 MED ORDER — OXYCODONE HCL 5 MG PO TABS
5.0000 mg | ORAL_TABLET | Freq: Once | ORAL | Status: AC
  Administered 2016-12-21: 5 mg via ORAL
  Filled 2016-12-21: qty 1

## 2016-12-21 NOTE — ED Notes (Signed)
ED Provider at bedside. 

## 2016-12-21 NOTE — ED Provider Notes (Signed)
[facility] EMERGENCY DEPARTMENT Provider Note   CSN: [id number] Arrival date & time: 12/21/16  [id number]     History   Chief Complaint Chief Complaint  Patient presents with  . Neck Pain    HPI Dana Riggs is a 26 y.o. female.  The history is provided by the patient. No language interpreter was used.  Neck Pain     Dana Riggs is a 26 y.o. female who presents to the Emergency Department complaining of neck pain.  She is a G4 P3 that presents for evaluation of 2 days of left-sided neck pain.  Pain begins at the bottom of her left jawline and radiates to the left lateral neck.  It is worse with swallowing.  She reports mild sore throat.  No shortness of breath, fevers, chest pain, numbness, weakness.  No known injuries.  No prior similar symptom.  She has tried Tylenol at home with no improvement in her symptoms.  She is also tried warm compresses.  She is currently [redacted] weeks pregnant.  She is feeling the baby move, no leakage of fluids or vaginal bleeding. History reviewed. No pertinent past medical history.  There are no active problems to display for this patient.   Past Surgical History:  Procedure Laterality Date  . DILATION AND CURETTAGE OF UTERUS      OB History    Gravida Para Term Preterm AB Living   2             SAB TAB Ectopic Multiple Live Births                   Home Medications    Prior to Admission medications   Medication Sig Start Date End Date Taking? Authorizing Provider  metroNIDAZOLE (FLAGYL) 500 MG tablet Take 1 tablet (500 mg total) by mouth 2 (two) times daily. 07/07/16   Mackuen, Cindee Saltourteney Lyn, MD    Family History No family history on file.  Social History Social History   Tobacco Use  . Smoking status: Current Every Day Smoker    Packs/day: 0.50    Types: Cigarettes    Last attempt to quit: 02/28/2014    Years since quitting: 2.8  . Smokeless tobacco: Never Used  Substance Use Topics  . Alcohol use: No   Comment: occasionally  . Drug use: No     Allergies   Patient has no known allergies.   Review of Systems Review of Systems  Musculoskeletal: Positive for neck pain.  All other systems reviewed and are negative.    Physical Exam Updated Vital Signs BP 118/71 (BP Location: Right Arm)   Pulse 92   Temp 98 F (36.7 C) (Oral)   Resp 16   Ht 5\' 6"  (1.676 m)   Wt 59.4 kg (131 lb)   LMP 06/14/2016   SpO2 100%   BMI 21.14 kg/m   Physical Exam  Constitutional: She is oriented to person, place, and time. She appears well-developed and well-nourished.  HENT:  Head: Normocephalic and atraumatic.  Right Ear: External ear normal.  Left Ear: External ear normal.  Mouth/Throat: Oropharynx is clear and moist.  Eyes: EOM are normal.  Neck: Neck supple. No thyromegaly present.  Tenderness to palpation over the left SCM with no appreciable masses or edema.  Cardiovascular: Normal rate and regular rhythm.  No murmur heard. Pulmonary/Chest: Effort normal and breath sounds normal. No respiratory distress.  Abdominal: Soft. There is no tenderness. There is no rebound and no guarding.  Musculoskeletal:  She exhibits no edema or tenderness.  2+ radial pulses bilaterally.  Lymphadenopathy:    She has no cervical adenopathy.  Neurological: She is alert and oriented to person, place, and time.  5 out of 5 grip strength bilaterally.  Skin: Skin is warm and dry.  Psychiatric: She has a normal mood and affect. Her behavior is normal.  Nursing note and vitals reviewed.    ED Treatments / Results  Labs (all labs ordered are listed, but only abnormal results are displayed) Labs Reviewed - No data to display  EKG  EKG Interpretation None       Radiology No results found.  Procedures Procedures (including critical care time)  Medications Ordered in ED Medications  oxyCODONE (Oxy IR/ROXICODONE) immediate release tablet 5 mg (not administered)     Initial Impression /  Assessment and Plan / ED Course  I have reviewed the triage vital signs and the nursing notes.  Pertinent labs & imaging results that were available during my care of the patient were reviewed by me and considered in my medical decision making (see chart for details).     Patient is [redacted] weeks pregnant here with left-sided atraumatic neck pain.  She is nontoxic appearing on examination and well-hydrated.  There is no evidence of dental infection, cervical adenopathy or thyromegaly.  Presentation is not consistent with acute infectious process, dissection, RPA, epiglottitis.  Given that patient is pregnant there are limited therapeutic options available.  Discussed continuing Tylenol, which she took prior to ED arrival.  Discussed warm compresses.  Will provide 1 dose of oxycodone after discussing the risks and benefits in the emergency department.  Do not feel like this would be an appropriate discharge medication.  Discussed with patient consulting with her OB/GYN whether or not she would be a candidate for ibuprofen.  Outpatient follow-up and return precautions discussed.  Final Clinical Impressions(s) / ED Diagnoses   Final diagnoses:  Neck pain on left side    ED Discharge Orders    None       Tilden Fossaees, Aniket Paye, MD 12/21/16 1047

## 2016-12-21 NOTE — ED Triage Notes (Signed)
L side neck pain radiating to jaw x 2 days. Pt is [redacted] weeks pregnant.

## 2016-12-21 NOTE — ED Notes (Signed)
Pt requesting to speak with EDP regarding medication choice. EDP made aware.

## 2018-04-08 ENCOUNTER — Emergency Department (HOSPITAL_BASED_OUTPATIENT_CLINIC_OR_DEPARTMENT_OTHER)
Admission: EM | Admit: 2018-04-08 | Discharge: 2018-04-08 | Disposition: A | Discharge status: Home / Self Care | Payer: Medicaid Other | Attending: Emergency Medicine | Admitting: Emergency Medicine

## 2018-04-08 ENCOUNTER — Encounter (HOSPITAL_BASED_OUTPATIENT_CLINIC_OR_DEPARTMENT_OTHER): Payer: Self-pay | Admitting: Adult Health

## 2018-04-08 ENCOUNTER — Other Ambulatory Visit: Payer: Self-pay

## 2018-04-08 DIAGNOSIS — K0889 Other specified disorders of teeth and supporting structures: Secondary | ICD-10-CM | POA: Insufficient documentation

## 2018-04-08 DIAGNOSIS — F17210 Nicotine dependence, cigarettes, uncomplicated: Secondary | ICD-10-CM | POA: Insufficient documentation

## 2018-04-08 DIAGNOSIS — M2669 Other specified disorders of temporomandibular joint: Secondary | ICD-10-CM | POA: Insufficient documentation

## 2018-04-08 DIAGNOSIS — M26622 Arthralgia of left temporomandibular joint: Secondary | ICD-10-CM

## 2018-04-08 MED ORDER — PENICILLIN V POTASSIUM 500 MG PO TABS: 500.0000 mg | ORAL_TABLET | Freq: Four times a day (QID) | ORAL | 0 refills | Status: AC

## 2018-04-08 NOTE — Discharge Instructions (Addendum)
Take Penicillin 4 times a day for one week Continue Ibuprofen Try a bit guard to help with teeth grinding at night Follow up with a dentist

## 2018-04-08 NOTE — ED Provider Notes (Signed)
[facility] EMERGENCY DEPARTMENT Provider Note   CSN: [id number] Arrival date & time: 04/08/18  1040    History   Chief Complaint Chief Complaint  Patient presents with  . Dental Pain    HPI Dana Riggs is a 28 y.o. female who presents with dental and jaw pain. No significant PMH. She states she has had dental pain for "awhile" but over the past couple days it has been getting worse. It is over the left lower jaw. It is a constant shooting pain. She feels a lot of pressure in the area like something is pushing on her teeth. She denies fever but is having trouble eating and talking due to pain. She previously had pain on the right side but started only eating on the left side and now the pain is all left sided. She has not seen a dentist in a long time. She takes 800mg  Ibuprofen without significant relief. She endorses teeth grinding at night.   HPI  History reviewed. No pertinent past medical history.  There are no active problems to display for this patient.   Past Surgical History:  Procedure Laterality Date  . DILATION AND CURETTAGE OF UTERUS       OB History    Gravida  2   Para      Term      Preterm      AB      Living        SAB      TAB      Ectopic      Multiple      Live Births               Home Medications    Prior to Admission medications   Medication Sig Start Date End Date Taking? Authorizing Provider  metroNIDAZOLE (FLAGYL) 500 MG tablet Take 1 tablet (500 mg total) by mouth 2 (two) times daily. 07/07/16   Mackuen, Cindee Salt, MD    Family History History reviewed. No pertinent family history.  Social History Social History   Tobacco Use  . Smoking status: Current Every Day Smoker    Packs/day: 0.50    Types: Cigarettes    Last attempt to quit: 02/28/2014    Years since quitting: 4.1  . Smokeless tobacco: Never Used  Substance Use Topics  . Alcohol use: No    Comment: occasionally  . Drug use: No      Allergies   Patient has no known allergies.   Review of Systems Review of Systems  Constitutional: Negative for fever.  HENT: Positive for dental problem.      Physical Exam Updated Vital Signs BP (!) 132/97 (BP Location: Left Arm)   Pulse 78   Temp 98.3 F (36.8 C) (Oral)   Resp 16   Ht 5\' 5"  (1.651 m)   Wt 52.2 kg   SpO2 100%   BMI 19.14 kg/m   Physical Exam Vitals signs and nursing note reviewed.  Constitutional:      General: She is not in acute distress.    Appearance: Normal appearance. She is well-developed.     Comments: Cooperative. Appears uncomfortable  HENT:     Head: Normocephalic and atraumatic.     Comments: Tenderness over L TMJ with mild trismus    Nose: Nose normal.     Mouth/Throat:     Lips: Pink.     Mouth: Mucous membranes are moist.     Dentition: Abnormal dentition (impacted L lower  wisdom tooth). No dental caries.     Pharynx: Oropharynx is clear.  Eyes:     General: No scleral icterus.       Right eye: No discharge.        Left eye: No discharge.     Conjunctiva/sclera: Conjunctivae normal.     Pupils: Pupils are equal, round, and reactive to light.  Neck:     Musculoskeletal: Normal range of motion.  Cardiovascular:     Rate and Rhythm: Normal rate.  Pulmonary:     Effort: Pulmonary effort is normal. No respiratory distress.  Abdominal:     General: There is no distension.  Skin:    General: Skin is warm and dry.  Neurological:     Mental Status: She is alert and oriented to person, place, and time.  Psychiatric:        Behavior: Behavior normal.      ED Treatments / Results  Labs (all labs ordered are listed, but only abnormal results are displayed) Labs Reviewed - No data to display  EKG None  Radiology No results found.  Procedures Procedures (including critical care time)  Medications Ordered in ED Medications - No data to display   Initial Impression / Assessment and Plan / ED Course  I have  reviewed the triage vital signs and the nursing notes.  Pertinent labs & imaging results that were available during my care of the patient were reviewed by me and considered in my medical decision making (see chart for details).  28 year old female presents with dental and jaw pain. Her teeth are actually in very good condition but it appears she does have an impacted wisdom tooth on the L lower side. She also has some TMJ tenderness so her symptoms may be multifactorial. She reports some swelling of the area. I do not see any obvious infection but will try Penicillin to see if this helps. She was encouraged to continue Ibuprofen and was given a referral to a dentist. She was also encouraged to try a bite guard to see if this helps.  Final Clinical Impressions(s) / ED Diagnoses   Final diagnoses:  Pain, dental  Tenderness of left temporomandibular joint    ED Discharge Orders    None       Bethel Born, PA-C 04/08/18 1117    Loren Racer, MD 04/08/18 1440

## 2018-04-08 NOTE — ED Triage Notes (Signed)
C/o left sided dental ongoing for a while, Ibuprofen not helping.

## 2018-06-20 ENCOUNTER — Emergency Department (HOSPITAL_BASED_OUTPATIENT_CLINIC_OR_DEPARTMENT_OTHER): Payer: Self-pay

## 2018-06-20 ENCOUNTER — Emergency Department (HOSPITAL_BASED_OUTPATIENT_CLINIC_OR_DEPARTMENT_OTHER)
Admission: EM | Admit: 2018-06-20 | Discharge: 2018-06-20 | Disposition: A | Discharge status: Home / Self Care | Payer: Self-pay | Attending: Emergency Medicine | Admitting: Emergency Medicine

## 2018-06-20 ENCOUNTER — Other Ambulatory Visit: Payer: Self-pay

## 2018-06-20 ENCOUNTER — Encounter (HOSPITAL_BASED_OUTPATIENT_CLINIC_OR_DEPARTMENT_OTHER): Payer: Self-pay | Admitting: *Deleted

## 2018-06-20 DIAGNOSIS — J029 Acute pharyngitis, unspecified: Secondary | ICD-10-CM | POA: Insufficient documentation

## 2018-06-20 DIAGNOSIS — F17210 Nicotine dependence, cigarettes, uncomplicated: Secondary | ICD-10-CM | POA: Insufficient documentation

## 2018-06-20 DIAGNOSIS — R072 Precordial pain: Secondary | ICD-10-CM | POA: Insufficient documentation

## 2018-06-20 LAB — BASIC METABOLIC PANEL
Anion gap: 7 (ref 5–15)
BUN: 10 mg/dL (ref 6–20)
CO2: 21 mmol/L — ABNORMAL LOW (ref 22–32)
Calcium: 8.5 mg/dL — ABNORMAL LOW (ref 8.9–10.3)
Chloride: 109 mmol/L (ref 98–111)
Creatinine, Ser: 0.82 mg/dL (ref 0.44–1.00)
GFR calc Af Amer: >60 mL/min (ref >60)
GFR calc non Af Amer: >60 mL/min (ref >60)
Glucose, Bld: 87 mg/dL (ref 70–99)
Potassium: 3.8 mmol/L (ref 3.5–5.1)
Sodium: 137 mmol/L (ref 135–145)

## 2018-06-20 LAB — CBC
HCT: 36.1 % (ref 36.0–46.0)
Hemoglobin: 11.6 g/dL — ABNORMAL LOW (ref 12.0–15.0)
MCH: 28.4 pg (ref 26.0–34.0)
MCHC: 32.1 g/dL (ref 30.0–36.0)
MCV: 88.3 fL (ref 80.0–100.0)
Platelets: 265 10*3/uL (ref 150–400)
RBC: 4.09 MIL/uL (ref 3.87–5.11)
RDW: 13.1 % (ref 11.5–15.5)
WBC: 8.6 10*3/uL (ref 4.0–10.5)
nRBC: 0 % (ref 0.0–0.2)

## 2018-06-20 LAB — GROUP A STREP BY PCR: Group A Strep by PCR: NOT DETECTED

## 2018-06-20 NOTE — ED Triage Notes (Signed)
Sore throat last night with tightness in her chest. No cough and no fever.

## 2018-06-20 NOTE — Discharge Instructions (Addendum)
Work-up today for the throat discomfort which is improved significantly negative for strep throat.  No complicating factors on physical exam regarding the throat.  Work-up for the chest pain without any acute findings.  Chest x-ray negative EKG without any significant abnormalities.  Heart marker test negative basic labs normal.  Make an appointment to follow-up with your regular doctor.  Return for any new or worse symptoms.

## 2018-06-20 NOTE — ED Provider Notes (Signed)
[facility] EMERGENCY DEPARTMENT Provider Note   CSN: [id number] Arrival date & time: 06/20/18  1057    History   Chief Complaint Chief Complaint  Patient presents with  . Sore Throat    HPI Dana Riggs is a 28 y.o. female.     Patient with onset of substernal chest pain around 2300 last night.  Continued till this morning.  And now has resolved.  Associated with a swelling feeling in the back of her throat and some throat discomfort.  Patient's had strep pharyngitis before she says sometimes it starts like this.  Chest pain is now resolved.  It was nonradiating.  Not associated with shortness of breath.  No wheezing or trouble breathing.  No rash or hives.  No leg swelling.     History reviewed. No pertinent past medical history.  There are no active problems to display for this patient.   Past Surgical History:  Procedure Laterality Date  . DILATION AND CURETTAGE OF UTERUS       OB History    Gravida  2   Para      Term      Preterm      AB      Living        SAB      TAB      Ectopic      Multiple      Live Births               Home Medications    Prior to Admission medications   Medication Sig Start Date End Date Taking? Authorizing Provider  metroNIDAZOLE (FLAGYL) 500 MG tablet Take 1 tablet (500 mg total) by mouth 2 (two) times daily. 07/07/16   Mackuen, Cindee Salt, MD    Family History No family history on file.  Social History Social History   Tobacco Use  . Smoking status: Current Every Day Smoker    Packs/day: 0.50    Types: Cigarettes    Last attempt to quit: 02/28/2014    Years since quitting: 4.3  . Smokeless tobacco: Never Used  Substance Use Topics  . Alcohol use: Yes    Comment: occasionally  . Drug use: No     Allergies   Patient has no known allergies.   Review of Systems Review of Systems  Constitutional: Negative for chills and fever.  HENT: Positive for sore throat. Negative for  congestion, rhinorrhea and trouble swallowing.   Eyes: Negative for visual disturbance.  Respiratory: Negative for cough and shortness of breath.   Cardiovascular: Positive for chest pain. Negative for leg swelling.  Gastrointestinal: Negative for abdominal pain, diarrhea, nausea and vomiting.  Genitourinary: Negative for dysuria.  Musculoskeletal: Negative for back pain and neck pain.  Skin: Negative for rash.  Neurological: Negative for dizziness, light-headedness and headaches.  Hematological: Does not bruise/bleed easily.  Psychiatric/Behavioral: Negative for confusion.     Physical Exam Updated Vital Signs BP 124/88   Pulse 83   Temp 98.1 F (36.7 C) (Oral)   Resp 18   Ht 1.651 m ( )   Wt 53.5 kg   LMP 06/15/2018   SpO2 100%   Breastfeeding Unknown   BMI 19.64 kg/m   Physical Exam Vitals signs and nursing note reviewed.  Constitutional:      General: She is not in acute distress.    Appearance: She is well-developed.  HENT:     Head: Normocephalic and atraumatic.     Mouth/Throat:  Mouth: Mucous membranes are moist.     Pharynx: Oropharynx is clear. Posterior oropharyngeal erythema present. No oropharyngeal exudate.     Comments: No significant swelling.  Slight erythema to the back of the throat.  No exudate. Eyes:     Extraocular Movements: Extraocular movements intact.     Conjunctiva/sclera: Conjunctivae normal.     Pupils: Pupils are equal, round, and reactive to light.  Neck:     Musculoskeletal: Normal range of motion and neck supple.  Cardiovascular:     Rate and Rhythm: Normal rate and regular rhythm.     Heart sounds: Normal heart sounds. No murmur.  Pulmonary:     Effort: Pulmonary effort is normal. No respiratory distress.     Breath sounds: Normal breath sounds.  Chest:     Chest wall: No tenderness.  Abdominal:     General: Bowel sounds are normal.     Palpations: Abdomen is soft.     Tenderness: There is no abdominal tenderness.   Musculoskeletal: Normal range of motion.        General: No swelling.  Skin:    General: Skin is warm and dry.     Capillary Refill: Capillary refill takes less than 2 seconds.  Neurological:     General: No focal deficit present.     Mental Status: She is alert and oriented to person, place, and time.      ED Treatments / Results  Labs (all labs ordered are listed, but only abnormal results are displayed) Labs Reviewed  CBC - Abnormal; Notable for the following components:      Result Value   Hemoglobin 11.6 (*)    All other components within normal limits  BASIC METABOLIC PANEL - Abnormal; Notable for the following components:   CO2 21 (*)    Calcium 8.5 (*)    All other components within normal limits  GROUP A STREP BY PCR    EKG None   ED ECG REPORT   Date: 06/20/2018  Rate: 84  Rhythm: normal sinus rhythm  QRS Axis: normal  Intervals: normal  ST/T Wave abnormalities: normal  Conduction Disutrbances:right bundle branch block  Narrative Interpretation:   Old EKG Reviewed: none available  I have personally reviewed the EKG tracing and agree with the computerized printout as noted.     Radiology Dg Chest Port 1 View  Result Date: 06/20/2018 CLINICAL DATA:  Chest pain, sore throat, and cough. EXAM: PORTABLE CHEST 1 VIEW COMPARISON:  None. FINDINGS: The heart size and mediastinal contours are within normal limits. Both lungs are clear. The visualized skeletal structures are unremarkable. IMPRESSION: Normal exam. Electronically Signed   By: Francene BoyersJames  Maxwell M.D.   On: 06/20/2018 12:11    Procedures Procedures (including critical care time)  Medications Ordered in ED Medications - No data to display   Initial Impression / Assessment and Plan / ED Course  I have reviewed the triage vital signs and the nursing notes.  Pertinent labs & imaging results that were available during my care of the patient were reviewed by me and considered in my medical decision  making (see chart for details).       Patient with a complaint of right-sided chest pain since around 2300 last night.  Has been constant.  But went away this morning.  No pain currently.  Also with some throat discomfort she felt as if it was swollen last night but does not feel swollen now.  Patient gets strep throat frequently.  Denies any rash hives or difficulty breathing.  No voice change.  Most of the symptoms are improving.  Work-up chest x-ray negative troponin negative basic labs without significant abnormality.  And rapid strep was negative.  Physical exam without any significant findings.  No evidence of any significant findings regarding the chest pain.  EKG without any acute changes.  Exact cause of symptoms not clear.  Seems to be noncardiac chest pain.  Reflux is a possibility.  Patient will give a trial of antacids to see if that helps when this occurs.  Patient significantly improved at this point in time.  Patient's EKG did not crossover.  Did have evidence of right bundle branch pattern.  But no acute ischemic changes.  Final Clinical Impressions(s) / ED Diagnoses   Final diagnoses:  Precordial pain  Sore throat    ED Discharge Orders    None       Vanetta Mulders, MD 06/20/18 1435

## 2019-03-05 ENCOUNTER — Other Ambulatory Visit: Payer: Self-pay

## 2019-03-05 ENCOUNTER — Emergency Department (HOSPITAL_BASED_OUTPATIENT_CLINIC_OR_DEPARTMENT_OTHER)
Admission: EM | Admit: 2019-03-05 | Discharge: 2019-03-05 | Disposition: A | Discharge status: Home / Self Care | Payer: HRSA Program | Attending: Emergency Medicine | Admitting: Emergency Medicine

## 2019-03-05 ENCOUNTER — Encounter (HOSPITAL_BASED_OUTPATIENT_CLINIC_OR_DEPARTMENT_OTHER): Payer: Self-pay

## 2019-03-05 DIAGNOSIS — F17210 Nicotine dependence, cigarettes, uncomplicated: Secondary | ICD-10-CM | POA: Insufficient documentation

## 2019-03-05 DIAGNOSIS — Z20822 Contact with and (suspected) exposure to covid-19: Secondary | ICD-10-CM

## 2019-03-05 DIAGNOSIS — R05 Cough: Secondary | ICD-10-CM | POA: Diagnosis present

## 2019-03-05 DIAGNOSIS — U071 COVID-19: Secondary | ICD-10-CM | POA: Diagnosis not present

## 2019-03-05 NOTE — Discharge Instructions (Signed)
Person Under Monitoring Name: Dana Riggs  Location: [address] [place] [address]   Infection Prevention Recommendations for Individuals Confirmed to have, or Being Evaluated for, 2019 Novel Coronavirus (COVID-19) Infection Who Receive Care at Home  Individuals who are confirmed to have, or are being evaluated for, COVID-19 should follow the prevention steps below until a healthcare provider or local or state health department says they can return to normal activities.  Stay home except to get medical care You should restrict activities outside your home, except for getting medical care. Do not go to work, school, or public areas, and do not use public transportation or taxis.  Call ahead before visiting your doctor Before your medical appointment, call the healthcare provider and tell them that you have, or are being evaluated for, COVID-19 infection. This will help the healthcare provider's office take steps to keep other people from getting infected. Ask your healthcare provider to call the local or state health department.  Monitor your symptoms Seek prompt medical attention if your illness is worsening (e.g., difficulty breathing). Before going to your medical appointment, call the healthcare provider and tell them that you have, or are being evaluated for, COVID-19 infection. Ask your healthcare provider to call the local or state health department.  Wear a facemask You should wear a facemask that covers your nose and mouth when you are in the same room with other people and when you visit a healthcare provider. People who live with or visit you should also wear a facemask while they are in the same room with you.  Separate yourself from other people in your home As much as possible, you should stay in a different room from other people in your home. Also, you should use a separate bathroom, if available.  Avoid sharing household items You should not share  dishes, drinking glasses, cups, eating utensils, towels, bedding, or other items with other people in your home. After using these items, you should wash them thoroughly with soap and water.  Cover your coughs and sneezes Cover your mouth and nose with a tissue when you cough or sneeze, or you can cough or sneeze into your sleeve. Throw used tissues in a lined trash can, and immediately wash your hands with soap and water for at least 20 seconds or use an alcohol-based hand rub.  Wash your [employer] your hands often and thoroughly with soap and water for at least 20 seconds. You can use an alcohol-based hand sanitizer if soap and water are not available and if your hands are not visibly dirty. Avoid touching your eyes, nose, and mouth with unwashed hands.   Prevention Steps for Caregivers and Household Members of Individuals Confirmed to have, or Being Evaluated for, COVID-19 Infection Being Cared for in the Home  If you live with, or provide care at home for, a person confirmed to have, or being evaluated for, COVID-19 infection please follow these guidelines to prevent infection:  Follow healthcare provider's instructions Make sure that you understand and can help the patient follow any healthcare provider instructions for all care.  Provide for the patient's basic needs You should help the patient with basic needs in the home and provide support for getting groceries, prescriptions, and other personal needs.  Monitor the patient's symptoms If they are getting sicker, call his or her medical provider and tell them that the patient has, or is being evaluated for, COVID-19 infection. This will help the healthcare provider's office  take steps to keep other people from getting infected. Ask the healthcare provider to call the local or state health department.  Limit the number of people who have contact with the patient If possible, have only one caregiver for the patient. Other  household members should stay in another home or place of residence. If this is not possible, they should stay in another room, or be separated from the patient as much as possible. Use a separate bathroom, if available. Restrict visitors who do not have an essential need to be in the home.  Keep older adults, very young children, and other sick people away from the patient Keep older adults, very young children, and those who have compromised immune systems or chronic health conditions away from the patient. This includes people with chronic heart, lung, or kidney conditions, diabetes, and cancer.  Ensure good ventilation Make sure that shared spaces in the home have good air flow, such as from an air conditioner or an opened window, weather permitting.  Wash your hands often Wash your hands often and thoroughly with soap and water for at least 20 seconds. You can use an alcohol based hand sanitizer if soap and water are not available and if your hands are not visibly dirty. Avoid touching your eyes, nose, and mouth with unwashed hands. Use disposable paper towels to dry your hands. If not available, use dedicated cloth towels and replace them when they become wet.  Wear a facemask and gloves Wear a disposable facemask at all times in the room and gloves when you touch or have contact with the patient's blood, body fluids, and/or secretions or excretions, such as sweat, saliva, sputum, nasal mucus, vomit, urine, or feces.  Ensure the mask fits over your nose and mouth tightly, and do not touch it during use. Throw out disposable facemasks and gloves after using them. Do not reuse. Wash your hands immediately after removing your facemask and gloves. If your personal clothing becomes contaminated, carefully remove clothing and launder. Wash your hands after handling contaminated clothing. Place all used disposable facemasks, gloves, and other waste in a lined container before disposing them with  other household waste. Remove gloves and wash your hands immediately after handling these items.  Do not share dishes, glasses, or other household items with the patient Avoid sharing household items. You should not share dishes, drinking glasses, cups, eating utensils, towels, bedding, or other items with a patient who is confirmed to have, or being evaluated for, COVID-19 infection. After the person uses these items, you should wash them thoroughly with soap and water.  Wash laundry thoroughly Immediately remove and wash clothes or bedding that have blood, body fluids, and/or secretions or excretions, such as sweat, saliva, sputum, nasal mucus, vomit, urine, or feces, on them. Wear gloves when handling laundry from the patient. Read and follow directions on labels of laundry or clothing items and detergent. In general, wash and dry with the warmest temperatures recommended on the label.  Clean all areas the individual has used often Clean all touchable surfaces, such as counters, tabletops, doorknobs, bathroom fixtures, toilets, phones, keyboards, tablets, and bedside tables, every day. Also, clean any surfaces that may have blood, body fluids, and/or secretions or excretions on them. Wear gloves when cleaning surfaces the patient has come in contact with. Use a diluted bleach solution (e.g., dilute bleach with 1 part bleach and 10 parts water) or a household disinfectant with a label that says EPA-registered for coronaviruses. To make a bleach  solution at home, add 1 tablespoon of bleach to 1 quart (4 cups) of water. For a larger supply, add  cup of bleach to 1 gallon (16 cups) of water. Read labels of cleaning products and follow recommendations provided on product labels. Labels contain instructions for safe and effective use of the cleaning product including precautions you should take when applying the product, such as wearing gloves or eye protection and making sure you have good ventilation  during use of the product. Remove gloves and wash hands immediately after cleaning.  Monitor yourself for signs and symptoms of illness Caregivers and household members are considered close contacts, should monitor their health, and will be asked to limit movement outside of the home to the extent possible. Follow the monitoring steps for close contacts listed on the symptom monitoring form.   ? If you have additional questions, contact your local health department or call the epidemiologist on call at [phone] (available 24/7). ? This guidance is subject to change. For the most up-to-date guidance from [facility], please refer to their website: [email or web address]

## 2019-03-05 NOTE — ED Triage Notes (Signed)
Pt with flu like sx x 1 week with +covid exposure-NAD-steady gait

## 2019-03-05 NOTE — ED Provider Notes (Signed)
Emergency Department Provider Note   I have reviewed the triage vital signs and the nursing notes.   HISTORY  Chief Complaint Cough   HPI Dana Riggs is a 29 y.o. female presents to the ED with cough, congestion, body aches, and fever for the past week. She notes a known COVID positive contact in the last 2 weeks. Denies significant SOB. No CP. Notes a sore throat and diarrhea. She has also lost her sense of smell. No radiation of symptoms or modifying factors.   History reviewed. No pertinent past medical history.  There are no problems to display for this patient.   Past Surgical History:  Procedure Laterality Date  . DILATION AND CURETTAGE OF UTERUS      Allergies Patient has no known allergies.  No family history on file.  Social History Social History   Tobacco Use  . Smoking status: Current Every Day Smoker    Packs/day: 0.50    Types: Cigarettes  . Smokeless tobacco: Never Used  Substance Use Topics  . Alcohol use: Not Currently  . Drug use: No    Review of Systems  Constitutional: Positive fever and body aches.  Eyes: No visual changes. ENT: Positive sore throat. Cardiovascular: Denies chest pain. Respiratory: Denies shortness of breath. Positive cough/congestion.  Gastrointestinal: No abdominal pain.  No nausea, no vomiting. Positive diarrhea.  No constipation. Genitourinary: Negative for dysuria. Musculoskeletal: Negative for back pain. Skin: Negative for rash. Neurological: Negative for focal weakness or numbness. Positive HA.   10-point ROS otherwise negative.  ____________________________________________   PHYSICAL EXAM:  VITAL SIGNS: ED Triage Vitals  Enc Vitals Group     BP 03/05/19 1249 112/83     Pulse Rate 03/05/19 1249 84     Resp 03/05/19 1249 16     Temp 03/05/19 1249 98.8 F (37.1 C)     Temp Source 03/05/19 1249 Oral     SpO2 03/05/19 1249 98 %     Weight 03/05/19 1241 115 lb (52.2 kg)     Height 03/05/19 1241  5\' 5"  (1.651 m)   Constitutional: Alert and oriented. Well appearing and in no acute distress. Eyes: Conjunctivae are normal. Head: Atraumatic. Nose: Positive congestion/rhinnorhea. Mouth/Throat: Mucous membranes are moist.  Oropharynx non-erythematous. Neck: No stridor.   Cardiovascular: Normal rate, regular rhythm. Good peripheral circulation. Grossly normal heart sounds.   Respiratory: Normal respiratory effort.  No retractions. Lungs CTAB. Gastrointestinal: No distention.  Musculoskeletal: No gross deformities of extremities. Neurologic:  Normal speech and language.  Skin:  Skin is warm, dry and intact. No rash noted.  ____________________________________________   LABS (all labs ordered are listed, but only abnormal results are displayed)  Labs Reviewed  NOVEL CORONAVIRUS, NAA (HOSP ORDER, SEND-OUT TO REF LAB; TAT 18-24 HRS)   ____________________________________________   PROCEDURES  Procedure(s) performed:   Procedures  None ____________________________________________   INITIAL IMPRESSION / ASSESSMENT AND PLAN / ED COURSE  Pertinent labs & imaging results that were available during my care of the patient were reviewed by me and considered in my medical decision making (see chart for details).   Patient with COVID like symptoms with known contact. No hypoxemia. Well appearing with no increased WOB. Plan for home isolation and f/u with COVID test on MyChart app. Information provided at discharge. No CP to suspect other COVID complication. Discussed supportive care and ED return precautions.   ____________________________________________  FINAL CLINICAL IMPRESSION(S) / ED DIAGNOSES  Final diagnoses:  Suspected COVID-19 virus infection  Note:  This document was prepared using Dragon voice recognition software and may include unintentional dictation errors.  Nanda Quinton, MD, [facility] Emergency Medicine    Tehani Mersman, [facility], MD 03/06/19 [phone]

## 2019-03-06 ENCOUNTER — Telehealth (HOSPITAL_COMMUNITY): Payer: Self-pay

## 2019-03-07 LAB — NOVEL CORONAVIRUS, NAA (HOSP ORDER, SEND-OUT TO REF LAB; TAT 18-24 HRS): SARS-CoV-2, NAA: DETECTED — AB

## 2019-03-09 ENCOUNTER — Telehealth (HOSPITAL_COMMUNITY): Payer: Self-pay

## 2019-09-26 IMAGING — DX PORTABLE CHEST - 1 VIEW
1 series · 1 of 1 positions shown · non-contrast
Comparison: None.

CLINICAL DATA: Chest pain, sore throat, and cough.

EXAM:
PORTABLE CHEST 1 VIEW

[chest ap]
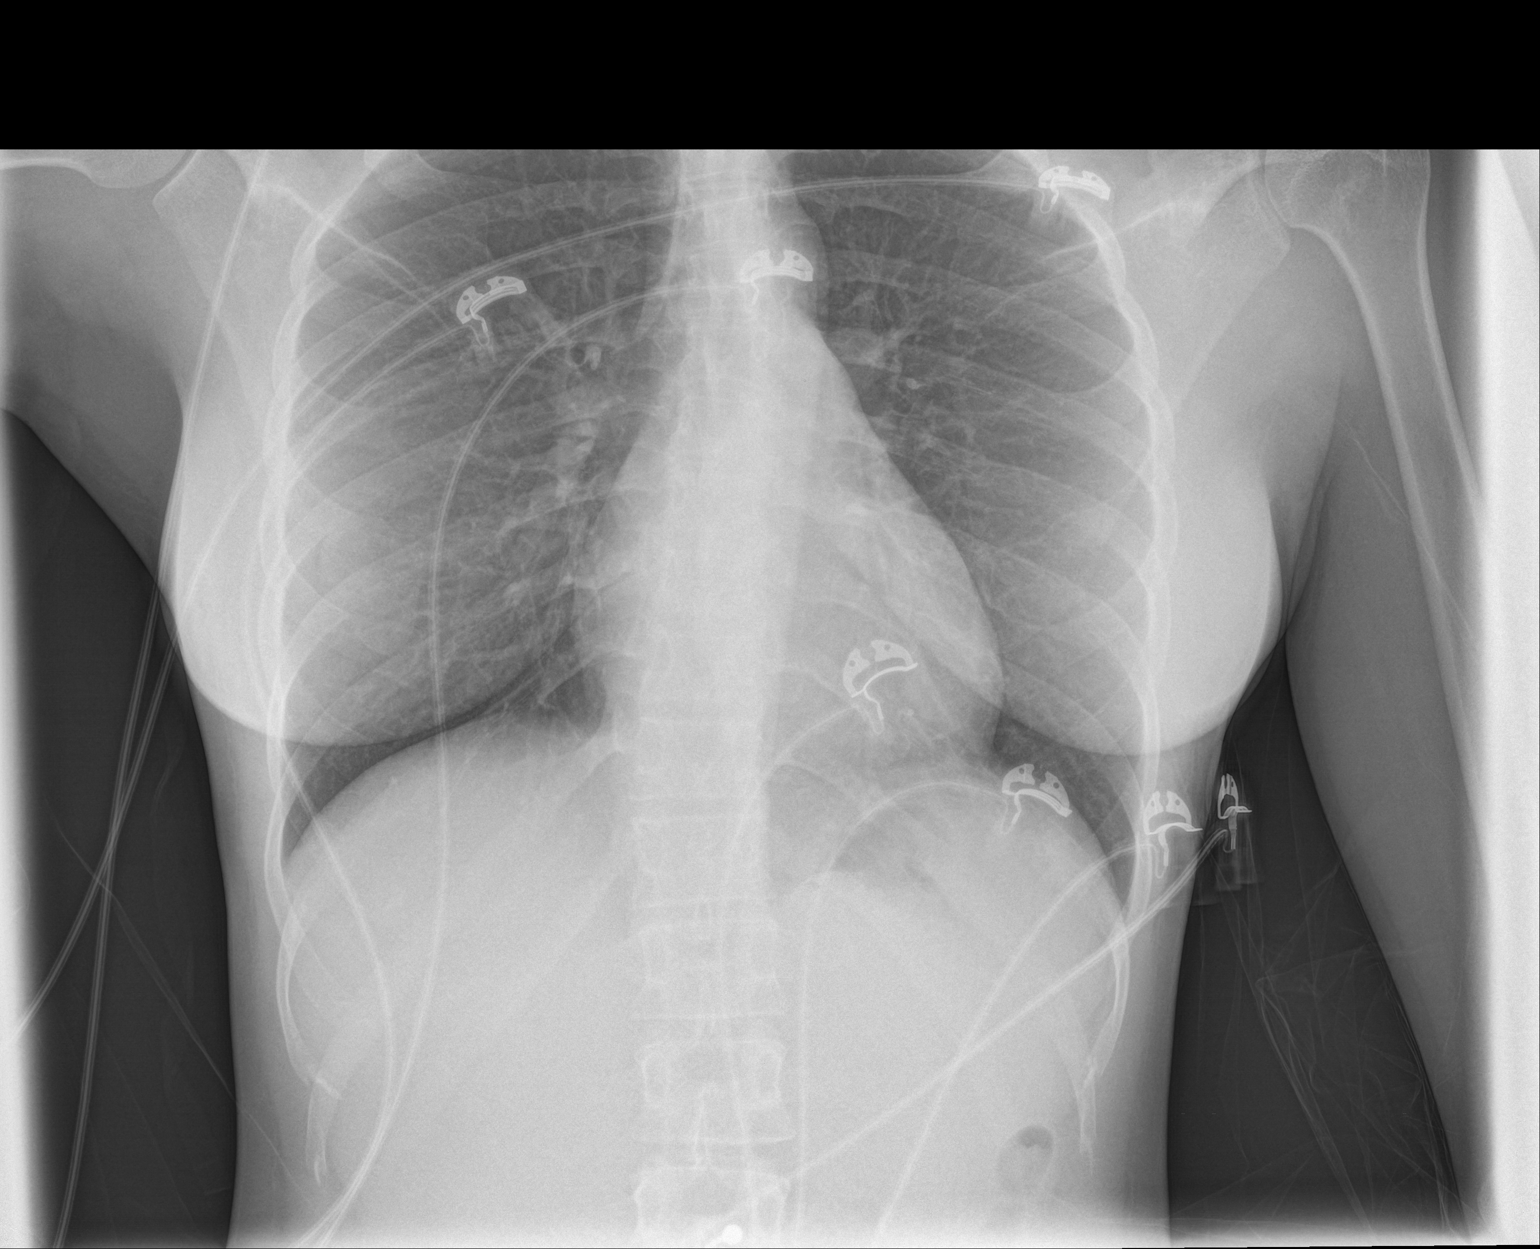

[1 of 1 positions shown; findings below may reference images not displayed]

FINDINGS: The heart size and mediastinal contours are within normal limits.
Both lungs are clear. The visualized skeletal structures are
unremarkable.
IMPRESSION: Normal exam.
